# Patient Record
Sex: Female | Born: 1975 | Race: Black or African American | Hispanic: No | Marital: Married | Smoking: Never smoker
Health system: Southern US, Community
[De-identification: ages and names within clinical notes are randomized; demographics above are authoritative.]

## PROBLEM LIST (undated history)

## (undated) DIAGNOSIS — R7303 Prediabetes: Secondary | ICD-10-CM

## (undated) DIAGNOSIS — E66813 Obesity, class 3: Secondary | ICD-10-CM

## (undated) DIAGNOSIS — Z8679 Personal history of other diseases of the circulatory system: Secondary | ICD-10-CM

## (undated) DIAGNOSIS — D573 Sickle-cell trait: Secondary | ICD-10-CM

## (undated) DIAGNOSIS — E0500 Thyrotoxicosis with diffuse goiter without thyrotoxic crisis or storm: Secondary | ICD-10-CM

## (undated) HISTORY — DX: Thyrotoxicosis with diffuse goiter without thyrotoxic crisis or storm: E05.00

## (undated) HISTORY — DX: Sickle-cell trait: D57.3

## (undated) HISTORY — DX: Morbid (severe) obesity due to excess calories: E66.01

## (undated) HISTORY — DX: Prediabetes: R73.03

## (undated) HISTORY — DX: Obesity, class 3: E66.813

## (undated) HISTORY — DX: Personal history of other diseases of the circulatory system: Z86.79

---

## 2005-08-25 ENCOUNTER — Ambulatory Visit: Payer: Self-pay | Admitting: Internal Medicine

## 2005-09-14 ENCOUNTER — Inpatient Hospital Stay (HOSPITAL_COMMUNITY): Admission: AD | Admit: 2005-09-14 | Discharge: 2005-09-15 | Payer: Self-pay | Admitting: Obstetrics and Gynecology

## 2005-10-14 ENCOUNTER — Encounter (INDEPENDENT_AMBULATORY_CARE_PROVIDER_SITE_OTHER): Payer: Self-pay | Admitting: Specialist

## 2005-10-14 ENCOUNTER — Inpatient Hospital Stay (HOSPITAL_COMMUNITY): Admission: AD | Admit: 2005-10-14 | Discharge: 2005-10-15 | Payer: Self-pay | Admitting: Obstetrics and Gynecology

## 2005-10-14 ENCOUNTER — Ambulatory Visit: Payer: Self-pay | Admitting: Obstetrics and Gynecology

## 2005-11-01 ENCOUNTER — Inpatient Hospital Stay (HOSPITAL_COMMUNITY): Admission: AD | Admit: 2005-11-01 | Discharge: 2005-11-01 | Payer: Self-pay | Admitting: *Deleted

## 2005-11-15 ENCOUNTER — Ambulatory Visit: Payer: Self-pay | Admitting: Obstetrics and Gynecology

## 2005-11-17 ENCOUNTER — Ambulatory Visit: Payer: Self-pay | Admitting: Obstetrics and Gynecology

## 2005-11-17 ENCOUNTER — Ambulatory Visit (HOSPITAL_COMMUNITY): Admission: RE | Admit: 2005-11-17 | Discharge: 2005-11-17 | Payer: Self-pay | Admitting: *Deleted

## 2005-12-29 ENCOUNTER — Ambulatory Visit: Payer: Self-pay | Admitting: Obstetrics and Gynecology

## 2005-12-29 ENCOUNTER — Encounter: Payer: Self-pay | Admitting: Family Medicine

## 2006-03-25 IMAGING — US US OB COMP LESS 14 WK
1 series · 14 of 21 positions shown · non-contrast
Comparison: none

CLINICAL DATA: Heavy vaginal bleeding.  Possible abortion. Patient should be 14 weeks 1 day by previous ultrasound.
 OBSTETRICAL ULTRASOUND <14 WKS:
TECHNIQUE: Transabdominal ultrasound was performed for evaluation of the gestation as well as the maternal uterus and adnexal regions.

[Series 1: us ob comp less 14 wk · 0.31mm/px · 14 of 21 slices shown]
[im 1/21]
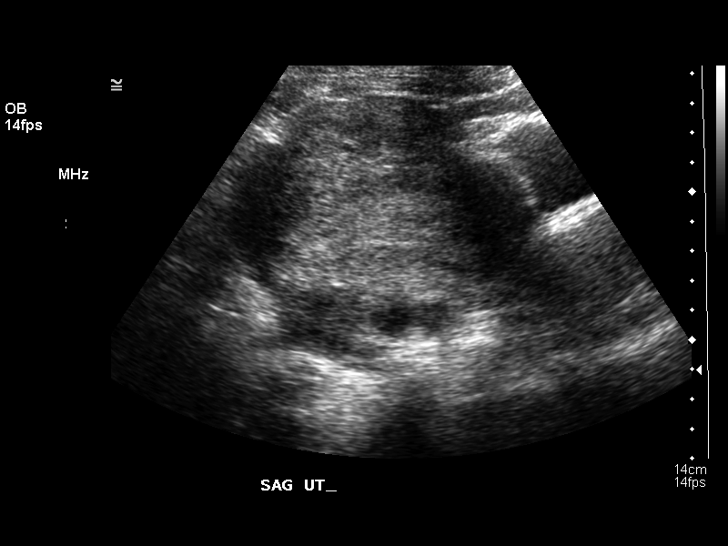
[im 3/21]
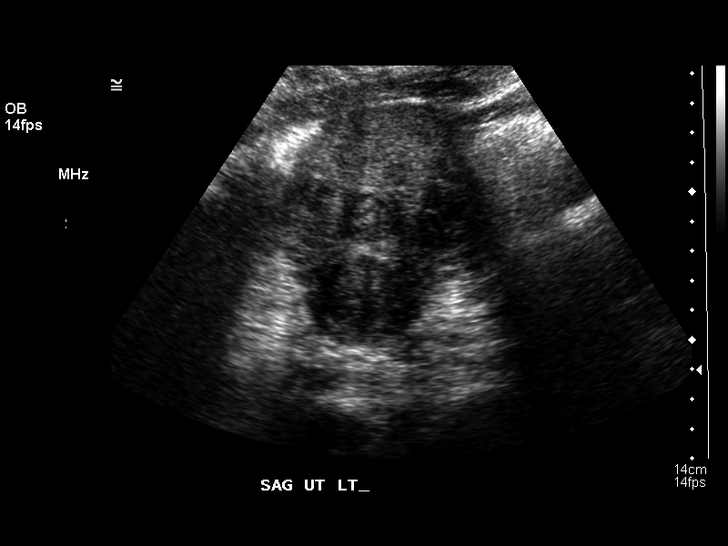
[im 4/21]
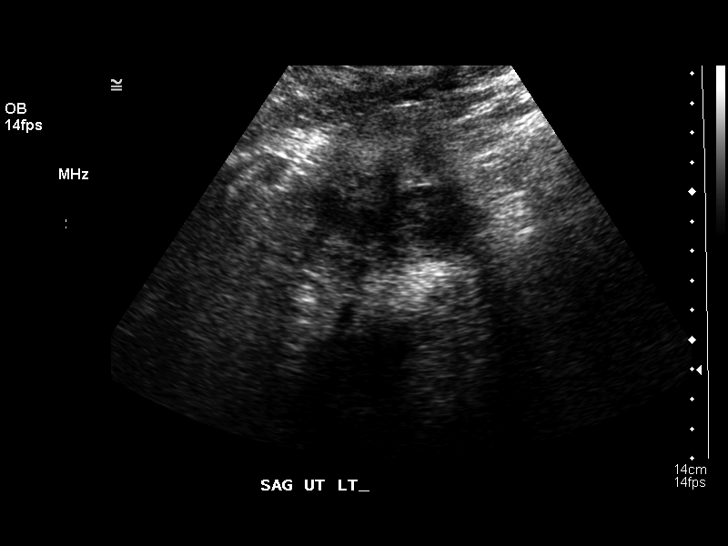
[im 6/21]
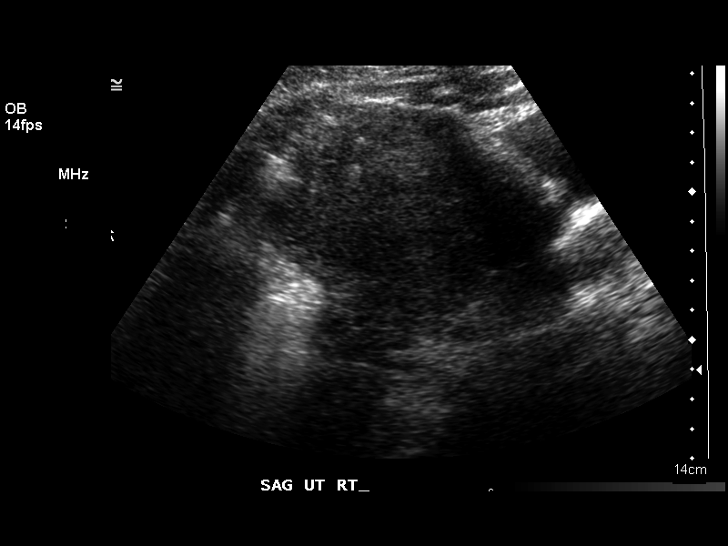
[im 7/21]
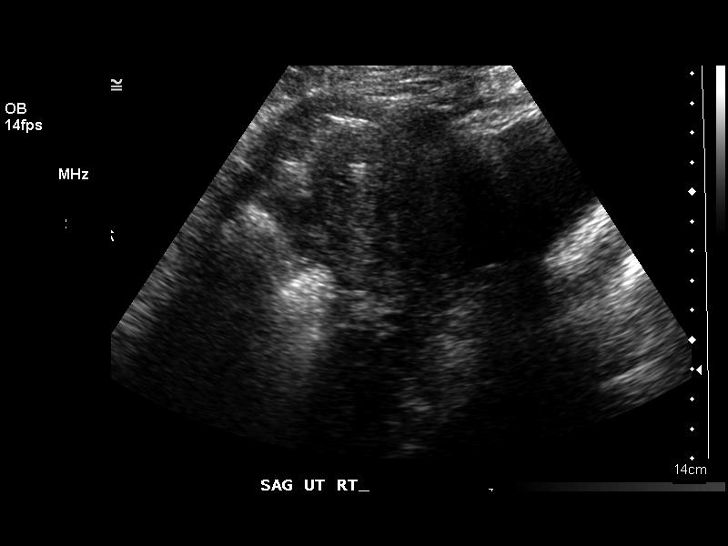
[im 9/21]
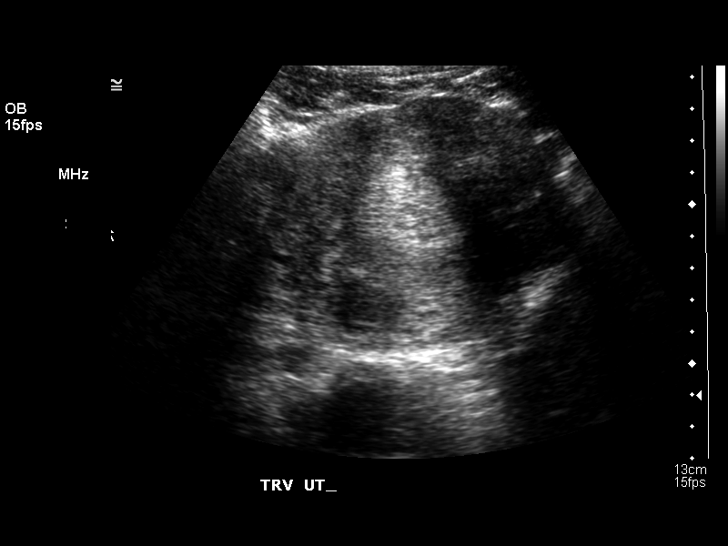
[im 10/21]
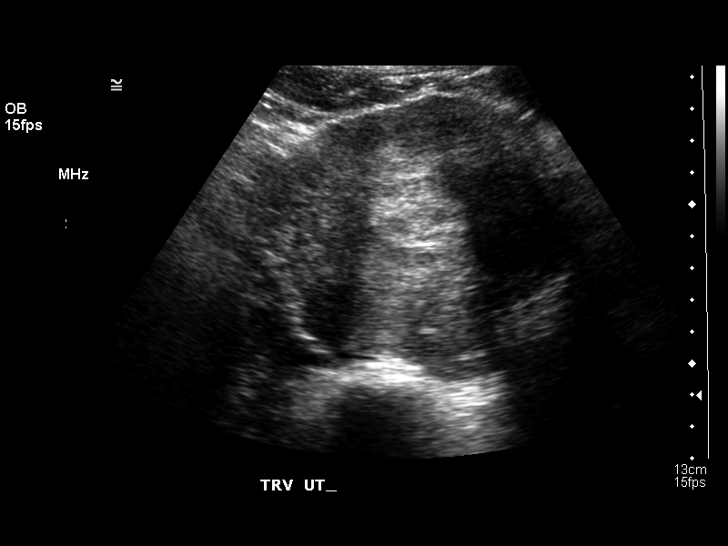
[im 12/21]
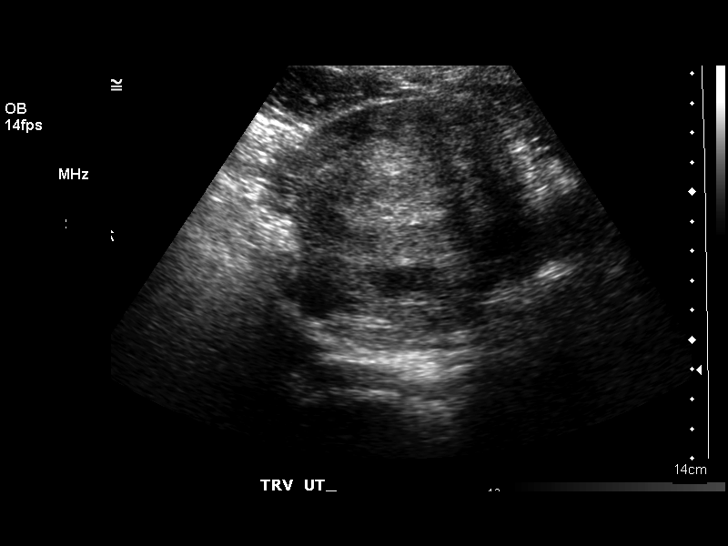
[im 13/21]
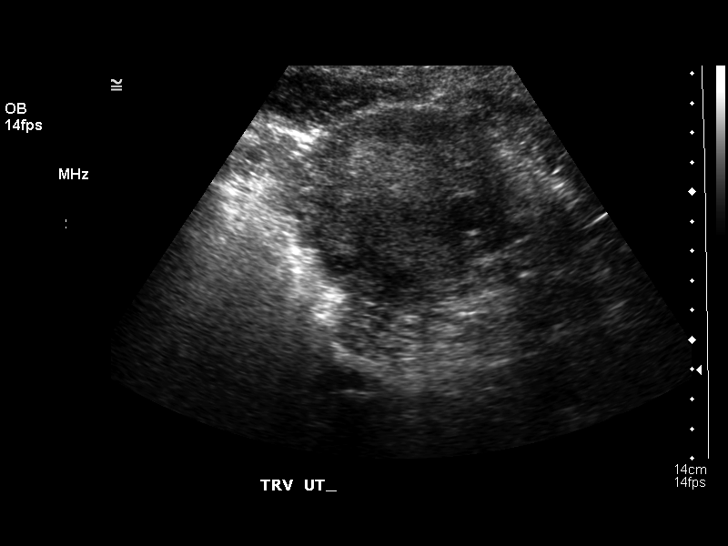
[im 15/21]
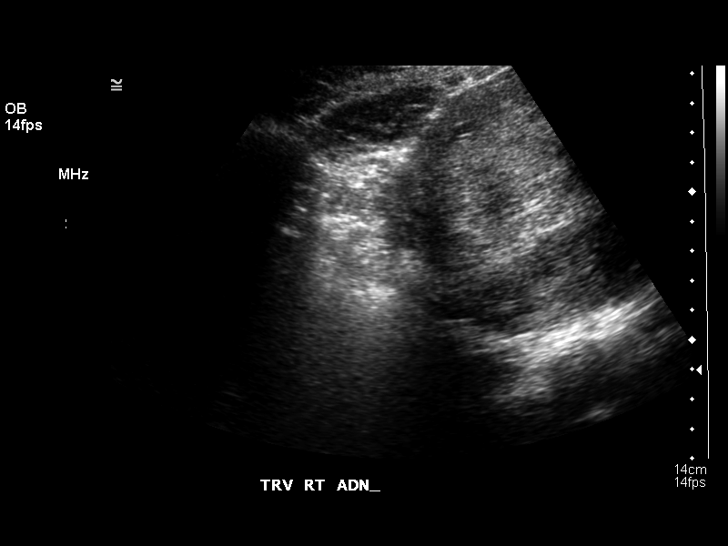
[im 16/21]
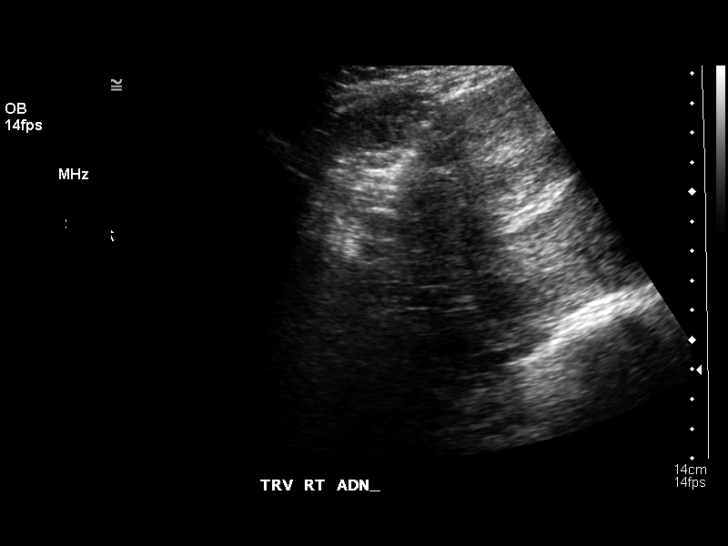
[im 18/21]
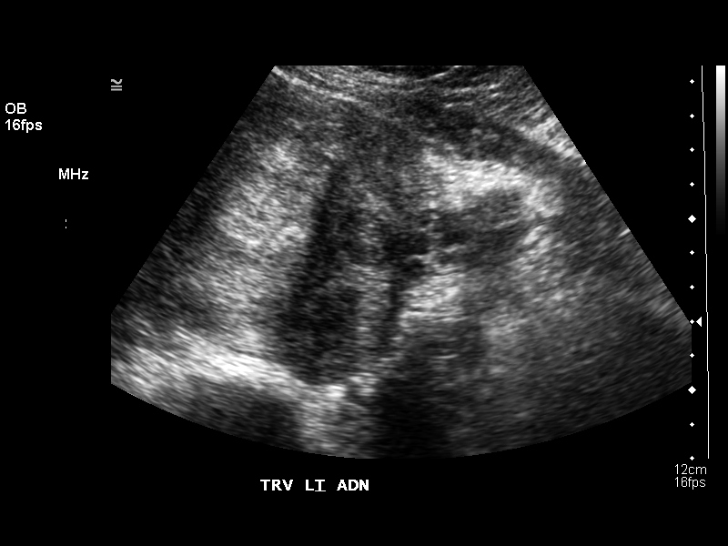
[im 19/21]
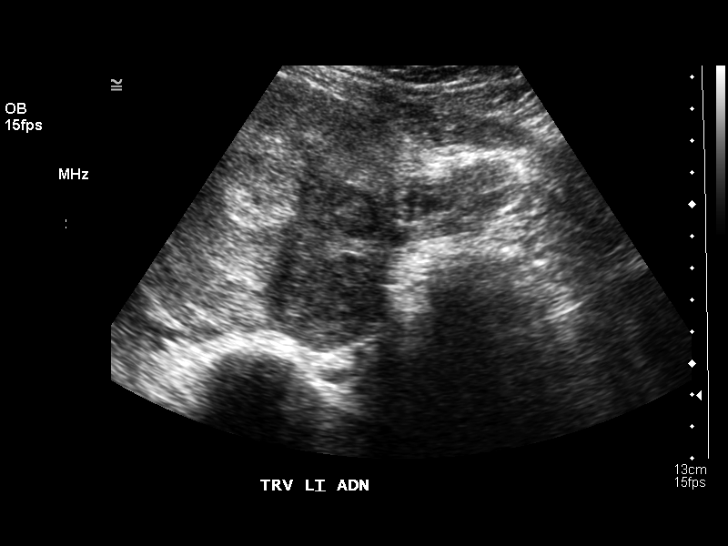
[im 21/21]
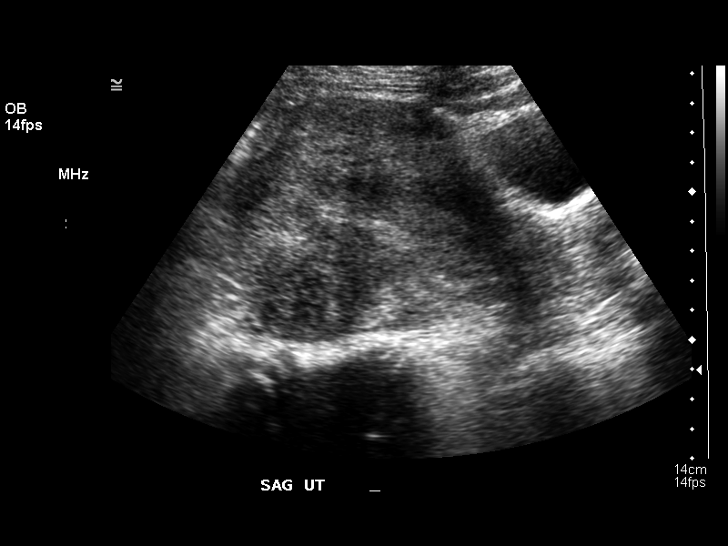

[14 of 21 positions shown; findings below may reference images not displayed]

FINDINGS: There is no sign of a viable pregnancy in the uterus. There is some echogenic material in the endometrial canal, but nothing that looks like a yolk sac or a fetus.  The findings are consistent with completed abortion.  
 The ovaries were not identified.  There is no free fluid.
IMPRESSION: No intrauterine pregnancy.  There is some echogenic material in the endometrial canal.  The findings are consistent with completed abortion.

## 2006-08-10 ENCOUNTER — Inpatient Hospital Stay (HOSPITAL_COMMUNITY): Admission: AD | Admit: 2006-08-10 | Discharge: 2006-08-10 | Payer: Self-pay | Admitting: Obstetrics & Gynecology

## 2007-01-17 ENCOUNTER — Ambulatory Visit: Payer: Self-pay | Admitting: Obstetrics & Gynecology

## 2007-01-17 ENCOUNTER — Encounter (INDEPENDENT_AMBULATORY_CARE_PROVIDER_SITE_OTHER): Payer: Self-pay | Admitting: Obstetrics & Gynecology

## 2007-01-19 ENCOUNTER — Ambulatory Visit (HOSPITAL_COMMUNITY): Admission: RE | Admit: 2007-01-19 | Discharge: 2007-01-19 | Payer: Self-pay | Admitting: Gynecology

## 2007-02-14 ENCOUNTER — Ambulatory Visit: Payer: Self-pay | Admitting: *Deleted

## 2007-03-07 ENCOUNTER — Ambulatory Visit: Payer: Self-pay | Admitting: Obstetrics & Gynecology

## 2007-03-28 ENCOUNTER — Inpatient Hospital Stay (HOSPITAL_COMMUNITY): Admission: AD | Admit: 2007-03-28 | Discharge: 2007-03-28 | Payer: Self-pay | Admitting: Obstetrics and Gynecology

## 2007-09-02 ENCOUNTER — Inpatient Hospital Stay (HOSPITAL_COMMUNITY): Admission: AD | Admit: 2007-09-02 | Discharge: 2007-09-06 | Payer: Self-pay | Admitting: Obstetrics & Gynecology

## 2007-09-03 ENCOUNTER — Encounter: Payer: Self-pay | Admitting: Obstetrics

## 2009-05-08 ENCOUNTER — Encounter: Admission: RE | Admit: 2009-05-08 | Discharge: 2009-05-08 | Payer: Self-pay | Admitting: Obstetrics and Gynecology

## 2009-10-05 ENCOUNTER — Encounter: Admission: RE | Admit: 2009-10-05 | Discharge: 2009-10-05 | Payer: Self-pay | Admitting: Obstetrics and Gynecology

## 2009-11-17 ENCOUNTER — Encounter: Admission: RE | Admit: 2009-11-17 | Discharge: 2009-11-17 | Payer: Self-pay | Admitting: Obstetrics and Gynecology

## 2010-01-13 ENCOUNTER — Emergency Department (HOSPITAL_COMMUNITY): Admission: EM | Admit: 2010-01-13 | Discharge: 2010-01-13 | Payer: Self-pay | Admitting: Emergency Medicine

## 2010-03-17 ENCOUNTER — Encounter: Admission: RE | Admit: 2010-03-17 | Discharge: 2010-03-17 | Payer: Self-pay | Admitting: Obstetrics and Gynecology

## 2010-05-18 ENCOUNTER — Encounter: Admission: RE | Admit: 2010-05-18 | Discharge: 2010-05-18 | Payer: Self-pay | Admitting: Obstetrics and Gynecology

## 2010-05-24 ENCOUNTER — Encounter: Admission: RE | Admit: 2010-05-24 | Discharge: 2010-05-24 | Payer: Self-pay | Admitting: Obstetrics and Gynecology

## 2011-02-07 ENCOUNTER — Other Ambulatory Visit: Payer: Self-pay | Admitting: Obstetrics

## 2011-02-07 DIAGNOSIS — O09529 Supervision of elderly multigravida, unspecified trimester: Secondary | ICD-10-CM

## 2011-02-09 ENCOUNTER — Ambulatory Visit (HOSPITAL_COMMUNITY)
Admission: RE | Admit: 2011-02-09 | Discharge: 2011-02-09 | Disposition: A | Discharge status: Home / Self Care | Payer: Medicaid Other | Source: Ambulatory Visit | Attending: Obstetrics | Admitting: Obstetrics

## 2011-02-09 ENCOUNTER — Other Ambulatory Visit: Payer: Self-pay | Admitting: Obstetrics

## 2011-02-09 DIAGNOSIS — O99210 Obesity complicating pregnancy, unspecified trimester: Secondary | ICD-10-CM | POA: Insufficient documentation

## 2011-02-09 DIAGNOSIS — O409XX0 Polyhydramnios, unspecified trimester, not applicable or unspecified: Secondary | ICD-10-CM | POA: Insufficient documentation

## 2011-02-09 DIAGNOSIS — O09529 Supervision of elderly multigravida, unspecified trimester: Secondary | ICD-10-CM | POA: Insufficient documentation

## 2011-02-09 DIAGNOSIS — O99810 Abnormal glucose complicating pregnancy: Secondary | ICD-10-CM | POA: Insufficient documentation

## 2011-02-09 DIAGNOSIS — O24919 Unspecified diabetes mellitus in pregnancy, unspecified trimester: Secondary | ICD-10-CM

## 2011-02-09 DIAGNOSIS — E669 Obesity, unspecified: Secondary | ICD-10-CM | POA: Insufficient documentation

## 2011-02-11 ENCOUNTER — Ambulatory Visit (HOSPITAL_COMMUNITY)
Admission: RE | Admit: 2011-02-11 | Discharge: 2011-02-11 | Disposition: A | Discharge status: Home / Self Care | Payer: Medicaid Other | Source: Ambulatory Visit | Attending: Obstetrics | Admitting: Obstetrics

## 2011-02-11 DIAGNOSIS — O409XX0 Polyhydramnios, unspecified trimester, not applicable or unspecified: Secondary | ICD-10-CM | POA: Insufficient documentation

## 2011-02-11 DIAGNOSIS — E669 Obesity, unspecified: Secondary | ICD-10-CM | POA: Insufficient documentation

## 2011-02-11 DIAGNOSIS — O99210 Obesity complicating pregnancy, unspecified trimester: Secondary | ICD-10-CM | POA: Insufficient documentation

## 2011-02-11 DIAGNOSIS — O09529 Supervision of elderly multigravida, unspecified trimester: Secondary | ICD-10-CM | POA: Insufficient documentation

## 2011-02-11 DIAGNOSIS — O99810 Abnormal glucose complicating pregnancy: Secondary | ICD-10-CM | POA: Insufficient documentation

## 2011-02-15 ENCOUNTER — Ambulatory Visit (HOSPITAL_COMMUNITY)
Admission: RE | Admit: 2011-02-15 | Discharge: 2011-02-15 | Disposition: A | Discharge status: Home / Self Care | Payer: Medicaid Other | Source: Ambulatory Visit | Attending: Obstetrics | Admitting: Obstetrics

## 2011-02-15 DIAGNOSIS — O99210 Obesity complicating pregnancy, unspecified trimester: Secondary | ICD-10-CM | POA: Insufficient documentation

## 2011-02-15 DIAGNOSIS — O409XX0 Polyhydramnios, unspecified trimester, not applicable or unspecified: Secondary | ICD-10-CM | POA: Insufficient documentation

## 2011-02-15 DIAGNOSIS — O99810 Abnormal glucose complicating pregnancy: Secondary | ICD-10-CM | POA: Insufficient documentation

## 2011-02-15 DIAGNOSIS — O09529 Supervision of elderly multigravida, unspecified trimester: Secondary | ICD-10-CM | POA: Insufficient documentation

## 2011-02-15 DIAGNOSIS — E669 Obesity, unspecified: Secondary | ICD-10-CM | POA: Insufficient documentation

## 2011-02-17 ENCOUNTER — Ambulatory Visit (HOSPITAL_COMMUNITY)
Admission: RE | Admit: 2011-02-17 | Discharge: 2011-02-17 | Disposition: A | Discharge status: Home / Self Care | Payer: Medicaid Other | Source: Ambulatory Visit | Attending: Obstetrics | Admitting: Obstetrics

## 2011-02-17 DIAGNOSIS — O99810 Abnormal glucose complicating pregnancy: Secondary | ICD-10-CM | POA: Insufficient documentation

## 2011-02-18 ENCOUNTER — Ambulatory Visit (HOSPITAL_COMMUNITY)
Admission: RE | Admit: 2011-02-18 | Discharge: 2011-02-18 | Disposition: A | Discharge status: Home / Self Care | Payer: Medicaid Other | Source: Ambulatory Visit | Attending: Obstetrics | Admitting: Obstetrics

## 2011-02-22 ENCOUNTER — Ambulatory Visit (HOSPITAL_COMMUNITY)
Admission: RE | Admit: 2011-02-22 | Discharge: 2011-02-22 | Disposition: A | Discharge status: Home / Self Care | Payer: Medicaid Other | Source: Ambulatory Visit | Attending: Obstetrics | Admitting: Obstetrics

## 2011-02-22 DIAGNOSIS — O409XX0 Polyhydramnios, unspecified trimester, not applicable or unspecified: Secondary | ICD-10-CM | POA: Insufficient documentation

## 2011-02-22 DIAGNOSIS — O99810 Abnormal glucose complicating pregnancy: Secondary | ICD-10-CM | POA: Insufficient documentation

## 2011-02-22 DIAGNOSIS — O09529 Supervision of elderly multigravida, unspecified trimester: Secondary | ICD-10-CM | POA: Insufficient documentation

## 2011-02-24 LAB — POCT PREGNANCY, URINE: Preg Test, Ur: NEGATIVE

## 2011-02-24 LAB — URINALYSIS, ROUTINE W REFLEX MICROSCOPIC
Bilirubin Urine: NEGATIVE
Glucose, UA: NEGATIVE mg/dL
Hgb urine dipstick: NEGATIVE
Ketones, ur: NEGATIVE mg/dL
Nitrite: NEGATIVE
Protein, ur: NEGATIVE mg/dL
Specific Gravity, Urine: 1.005 (ref 1.005–1.030)
Urobilinogen, UA: 1 mg/dL (ref 0.0–1.0)
pH: 7 (ref 5.0–8.0)

## 2011-02-25 ENCOUNTER — Ambulatory Visit (HOSPITAL_COMMUNITY): Payer: Medicaid Other

## 2011-02-25 ENCOUNTER — Other Ambulatory Visit: Payer: Self-pay | Admitting: Obstetrics

## 2011-02-25 ENCOUNTER — Ambulatory Visit (HOSPITAL_COMMUNITY)
Admission: RE | Admit: 2011-02-25 | Discharge: 2011-02-25 | Disposition: A | Discharge status: Home / Self Care | Payer: Medicaid Other | Source: Ambulatory Visit | Attending: Obstetrics | Admitting: Obstetrics

## 2011-02-25 DIAGNOSIS — O99810 Abnormal glucose complicating pregnancy: Secondary | ICD-10-CM | POA: Insufficient documentation

## 2011-02-25 DIAGNOSIS — O24919 Unspecified diabetes mellitus in pregnancy, unspecified trimester: Secondary | ICD-10-CM

## 2011-02-25 DIAGNOSIS — O09529 Supervision of elderly multigravida, unspecified trimester: Secondary | ICD-10-CM | POA: Insufficient documentation

## 2011-02-27 ENCOUNTER — Inpatient Hospital Stay (HOSPITAL_COMMUNITY)
Admission: AD | Admit: 2011-02-27 | Discharge: 2011-03-03 | DRG: 766 | Disposition: A | Discharge status: Home / Self Care | Payer: Medicaid Other | Source: Ambulatory Visit | Attending: Obstetrics | Admitting: Obstetrics

## 2011-02-27 ENCOUNTER — Other Ambulatory Visit: Payer: Self-pay | Admitting: Obstetrics

## 2011-02-27 DIAGNOSIS — O322XX0 Maternal care for transverse and oblique lie, not applicable or unspecified: Secondary | ICD-10-CM | POA: Diagnosis present

## 2011-02-27 DIAGNOSIS — O99814 Abnormal glucose complicating childbirth: Secondary | ICD-10-CM | POA: Diagnosis present

## 2011-02-27 DIAGNOSIS — O34219 Maternal care for unspecified type scar from previous cesarean delivery: Principal | ICD-10-CM | POA: Diagnosis present

## 2011-02-27 DIAGNOSIS — O09529 Supervision of elderly multigravida, unspecified trimester: Secondary | ICD-10-CM | POA: Diagnosis present

## 2011-02-27 LAB — TYPE AND SCREEN: ABO/RH(D): O POS

## 2011-02-27 LAB — CBC
HCT: 38.2 % (ref 36.0–46.0)
MCH: 29.4 pg (ref 26.0–34.0)
MCHC: 34 g/dL (ref 30.0–36.0)
Platelets: 160 10*3/uL (ref 150–400)
RDW: 14.7 % (ref 11.5–15.5)

## 2011-02-27 LAB — GLUCOSE, CAPILLARY
Glucose-Capillary: 105 mg/dL — ABNORMAL HIGH (ref 70–99)
Glucose-Capillary: 191 mg/dL — ABNORMAL HIGH (ref 70–99)

## 2011-02-27 LAB — ABO/RH: ABO/RH(D): O POS

## 2011-02-28 LAB — CBC
HCT: 27.3 % — ABNORMAL LOW (ref 36.0–46.0)
Hemoglobin: 9.2 g/dL — ABNORMAL LOW (ref 12.0–15.0)
MCH: 29.3 pg (ref 26.0–34.0)
MCHC: 33.7 g/dL (ref 30.0–36.0)
MCV: 86.9 fL (ref 78.0–100.0)
RDW: 15.1 % (ref 11.5–15.5)

## 2011-03-02 ENCOUNTER — Ambulatory Visit (HOSPITAL_COMMUNITY): Payer: Medicaid Other

## 2011-03-09 NOTE — Discharge Summary (Signed)
  NAMEGALINA, Tonya Stokes           ACCOUNT NO.:  [id number]  MEDICAL RECORD NO.:  [id number]           PATIENT TYPE:  I  LOCATION:  9319                          [facility]  PHYSICIAN:  Roseanna Rainbow, M.D.DATE OF BIRTH:  [birth year]  DATE OF ADMISSION:  02/27/2011 DATE OF DISCHARGE:  03/03/2011                              DISCHARGE SUMMARY   CHIEF COMPLAINT:  The patient is a 35 year old gravida 3, para 1 with an estimated date of confinement of March 16, 2011 who presents complaining of leaking clear fluid.  HISTORY OF PRESENT ILLNESS:  Please see the above antepartum course.  OBSTETRICAL RISK FACTORS: 1. History of previous cesarean delivery. 2. History of a previous myomectomy. 3. Gestational diabetes, on an oral agent.  PAST SURGICAL HISTORY:  Please see the above.  HOME MEDICATIONS:  Prenatal vitamins and glyburide.  ALLERGIES:  No known drug allergies.  SOCIAL HISTORY:  She denies any tobacco, ethanol, or drug use.  FAMILY HISTORY:  Diabetes mellitus.  PHYSICAL EXAMINATION:  VITAL SIGNS:  Stable, afebrile. LUNGS:  Clear to auscultation bilaterally. HEART:  Regular rate and rhythm. ABDOMEN:  Gravid, nontender. PELVIC:  Cervix is long and closed.  ASSESSMENT: 1. Intrauterine pregnancy at 37+ weeks. 2. History of a previous cesarean delivery. 3. Prior myomectomy.  PLAN:  Admission, repeat cesarean delivery.  HOSPITAL COURSE:  The patient was admitted and underwent a repeat cesarean delivery.  Please see the dictated operative summary for findings.  On postoperative day #1, the hemoglobin was 9.  Mefoxin was continued postoperatively for surgical site infection prophylaxis.  The remainder of her hospital course was uneventful.  She was discharged to home on postoperative day #4.  DISCHARGE DIAGNOSES: 1. Intrauterine pregnancy at 37 weeks. 2. Spontaneous rupture of membranes. 3. History of a previous cesarean delivery. 4. Prior  yomectomy.  PROCEDURES:  Repeat classical cesarean delivery.  CONDITION:  Stable.  DIET:  Regular.  ACTIVITY:  Pelvic rest, progressive activity.  DISCHARGE MEDICATIONS:  Percocet 5/325 one to two tablets every 6 hours as needed.  DISPOSITION:  The patient was to follow up in the office in several days for staple removal.     Roseanna Rainbow, M.D.     LAJ/MEDQ  D:  03/03/2011  T:  03/04/2011  Job:  [id number]  Electronically Signed by Antionette Char M.D. on 03/09/2011 09:59:53 PM

## 2011-03-10 NOTE — Op Note (Signed)
Tonya Stokes, UNCAPHER           ACCOUNT NO.:  [id number]  MEDICAL RECORD NO.:  [id number]           PATIENT TYPE:  I  LOCATION:  9319                          [facility]  PHYSICIAN:  Marlita Keil A. Clearance Coots, M.D.DATE OF BIRTH:  11/18/1976  DATE OF PROCEDURE:  02/27/2011 DATE OF DISCHARGE:                              OPERATIVE REPORT   PREOPERATIVE DIAGNOSES:  A 37 weeks' gestation, spontaneous rupture of membranes, gestational diabetes, previous myomectomy, previous cesarean section.  POSTOPERATIVE DIAGNOSES:  A 37 weeks' gestation, spontaneous rupture of membranes, gestational diabetes, previous myomectomy, previous cesarean section, transverse lie.  PROCEDURE:  Repeat classical cesarean section.  SURGEON:  Darrik Richman A. Clearance Coots, M.D.  ANESTHESIA:  Spinal.  ESTIMATED BLOOD LOSS:  1200 mL.  IV FLUIDS:  3500 mL.  URINE OUTPUT:  300 mL clear.  COMPLICATIONS:  None.  Foley to gravity.  FINDINGS:  Viable female at 04:03, Apgars of 2 at 1 minute, 5 at 7 minutes, 7 at 10 minutes, weight of 3101 g.  Normal uterus, ovaries, and fallopian tubes.  SPECIMEN:  Placenta.  DISPOSITION OF SPECIMEN:  Pathology.  OPERATION:  The patient was brought to the operating room and after satisfactory spinal anesthesia, the abdomen was prepped and draped in the usual sterile fashion with an indwelling Foley.  A Pfannenstiel skin incision was made with the scalpel through the previous scar down to the fascia.  The fascia was nicked in the midline and the fascial incision was extended to the left and to the right with curved Mayo scissors. The superior and inferior fascial edges were then taken off of the rectus muscles, both blunt and sharp dissection.  The rectus muscle was bluntly divided in the midline and sharply incised superiorly and inferiorly.  The parietal peritoneum was then entered and the incision was extended superiorly and inferiorly with Metzenbaum scissors being careful to  avoid the urinary bladder inferiorly.  The bladder blade was then positioned in the incision.  There was no development of the lower uterine segment with multiple vascular sinuses in the very narrow lower uterine segment.  A decision was made to proceed with classical incision into the uterus.  Classical incision was made with the scalpel extending the incision up into the uterine fundus.  The fetus was noted to be a transverse lie and the vertex was almost obliquely into the incision and the vertex was therefore rotated into the incision with much difficulty because of hyperextension of the vertex and the fetus essentially balled up in a transverse lie.  I could not get adequate access to the vertex and therefore, a vertical skin incision was also made with the scalpel down to the fascia.  The fascial incision was extended superiorly and inferiorly with curved Mayo scissors.  The rectus muscle was bluntly divided in the midline.  Adequate access was obtained after converting the skin incision to a vertical skin incision.  The occiput was rotated into the incision and mushroom Mityvac was placed on the occiput and the vertex was extended into the incision and delivered with the aid of one pull of the vacuum extractor and fundal pressure from the assistant. The  infant's mouth and nose were suctioned with a suction bulb and the delivery was completed with the aid of fundal pressure from the assistant.  Umbilical cord was doubly clamped and cut and the infant was handed off to the nursery staff.  Cord pH and cord blood were obtained. Placenta was manually removed from the uterus intact.  The uterus was exteriorized.  The uterus was closed with the classical incision closed first in 3 layers with inner layer of continuous interlocking suture of 0 Monocryl and the myometrial layer closed with a continuous suture of 0 Monocryl.  The serosa and outer myometrium was closed with  continuous interlocking suture of 0 Monocryl.  Hemostasis was excellent.  The pelvic cavity was then thoroughly irrigated with warm saline solution and all clots were removed.  The abdomen was then closed as follows. The peritoneum was closed with a continuous suture of 2-0 Monocryl.  The fascia was closed in 2 approaches, the vertical fascial incision was closed with a continuous suture of #1 PDS.  The transverse fascial incision was closed with a continuous suture of #1 PDS from each corner to the center.  The vertical subcutaneous tissue was closed with a continuous suture of 0 plain catgut.  The vertical skin was closed with stainless steel staples.  The Pfannenstiel skin incision was closed with stainless steel staples.  A sterile pressure bandage was applied to the incision closure.  The patient tolerated the procedure well, was transported to recovery room in satisfactory condition.     Tonya Stokes A. Clearance Coots, M.D.     CAH/MEDQ  D:  02/27/2011  T:  02/27/2011  Job:  [id number]  Electronically Signed by Coral Ceo M.D. on 03/10/2011 11:18:52 AM

## 2011-04-19 NOTE — Op Note (Signed)
Tonya Stokes, Tonya Stokes           ACCOUNT NO.:  [id number]   MEDICAL RECORD NO.:  [id number]          PATIENT TYPE:  INP   LOCATION:  9166                          [facility]   PHYSICIAN:  Charles A. Clearance Coots, M.D.DATE OF BIRTH:  11-19-1976   DATE OF PROCEDURE:  09/03/2007  DATE OF DISCHARGE:                               OPERATIVE REPORT   PREOPERATIVE DIAGNOSES:  1. Forty-one weeks' gestation.  2. Previous myomectomy.  3. Elective cesarean section.   POSTOPERATIVE DIAGNOSES:  1. Forty-one weeks' gestation.  2. Previous myomectomy.  3. Elective cesarean section.   PROCEDURE:  Primary low transverse cesarean section.   SURGEON:  Charles A. Clearance Coots, M.D.   ASSISTANT:  Kathlee Nations, certified surgical technician.   ANESTHESIA:  Spinal.   ESTIMATED BLOOD LOSS:  800 mL.   IV FLUIDS:  2000 mL.   URINE OUTPUT:  100 mL clear.   COMPLICATIONS:  None.   Foley to gravity.   FINDINGS:  A viable female at 11:50, Apgars of 8 at one minute and 9 at  five minutes, a weight of 9 pounds 6 ounces.  Normal uterus, ovaries and  fallopian tubes.   OPERATION:  The patient was brought to the operating room and after  satisfactory spinal anesthesia, the abdomen was prepped and draped in  the usual sterile fashion.  A Pfannenstiel skin incision was made with  down to the fascia with a scalpel.  Fascia was nicked in the midline and  the fascial incision was extended to the left and to the right with  curved Mayo scissors.  The superior and inferior fascial edges were  taken off of the rectus muscles with both blunt sharp dissection.  The  rectus muscle was bluntly and sharply divided in the midline and the  peritoneum was entered digitally and was digitally extended to the left  and to the right.  The bladder blade was positioned and the  vesicouterine fold of peritoneum above the reflection of the urinary  bladder was grasped with forceps and was incised and undermined with  Metzenbaum scissors.  The incision was extended to left and to the right  with the Metzenbaum scissors.  The bladder flap was bluntly developed  and the bladder blade was repositioned in front of the urinary bladder,  placing it well out of the operative field.  The uterus was then entered  transversely in the lower uterine segment with the scalpel.  Clear  amniotic fluid was expelled.  The uterine incision was then extended to  the left and to the right digitally.  The occiput was noted to be left  occiput posterior and the occiput was rotated to an anterior position  and brought up into the incision.  There was still some difficulty in  delivering the vertex and then a Mityvac mushroom cap-type vacuum  extractor was then applied to the occiput and the vertex was then flexed  into the incision and the delivery was accomplished with the aid of  vacuum extraction and fundal pressure from the assistant.  The infant's  mouth and nose were suctioned with a suction bulb and delivery  was  completed with the aid of fundal pressure from the assistant.  The  placenta was then manually removed from the uterus intact.  The  endometrial surface was thoroughly debrided with a dry lap sponge.  The  edges of the uterine incision were grasped with Allis and ring forceps  and the uterus was closed from each corner to the center with a  continuous interlocking suture of zero Monocryl.  Hemostasis was  excellent.  The pelvic cavity was then thoroughly irrigated with warm  saline solution and all clots were removed.  The abdomen was then closed  as follows:  The parietal peritoneum was closed with a continuous suture  of 2-0 Monocryl.  The fascia was closed with a continuous suture of 0  PDS from each corner to the center.  The subcutaneous tissue was  thoroughly irrigated with warm saline solution and all areas of  subcutaneous bleeding were coagulated with the Bovie.  The skin was then  closed with  stainless steel staples.  Sterile bandages applied to the  incision closure.  The surgical technician then again indicated that all  needle, sponge and instrument counts were correct x2.  The patient  tolerated the procedure well and was transported to the recovery room in  satisfactory condition.      Charles A. Clearance Coots, M.D.  Electronically Signed     CAH/MEDQ  D:  09/03/2007  T:  09/03/2007  Job:  [id number]

## 2011-04-19 NOTE — Discharge Summary (Signed)
Tonya Stokes, Tonya Stokes           ACCOUNT NO.:  [id number]   MEDICAL RECORD NO.:  [id number]          PATIENT TYPE:  INP   LOCATION:  9102                          [facility]   PHYSICIAN:  Charles A. Clearance Coots, M.D.DATE OF BIRTH:  1976/11/06   DATE OF ADMISSION:  09/02/2007  DATE OF DISCHARGE:  09/06/2007                               DISCHARGE SUMMARY   ADMITTING DIAGNOSIS:  Post-dates pregnancy, induction of labor.   DISCHARGE DIAGNOSIS:  Post-dates pregnancy, induction of labor.  Status  post primary low transverse cesarean section for previous myomectomy.  Viable female delivered via on September 03, 2007 at 11:50.  Apgars of 8  at one minute and 9 at five minutes, weight of 42-65 grams, length of  53.5 cm.  Mother and infant discharged home in good condition.   REASON FOR ADMISSION:  A 35 year old, para 0, black female presented for  induction of labor for postdates.  Upon review of the patient's surgical  history, it was discovered that she had had a previous myomectomy for  fibroids and was recommended that she not have vaginal deliveries for  any future pregnancies, and cesarean section was recommended.  The  patient was therefore apprised of these recommendations and was taken to  the operating room for cesarean section delivery.   PAST MEDICAL HISTORY:  Surgery:  Cesarean section.  Illnesses:  Sickle  cell trait.   MEDICATIONS:  Prenatal vitamins.   ALLERGIES:  NO KNOWN DRUG ALLERGIES.   SOCIAL HISTORY:  Married.  Negative history of tobacco, alcohol or  recreational drug use.   FAMILY HISTORY:  Noncontributory.   PHYSICAL EXAMINATION:  Well-nourished, well-developed female in no acute  distress.  Afebrile, vital signs were stable.  LUNGS:  Were clear to auscultation bilaterally.  HEART:  Regular rate and rhythm.  ABDOMEN:  Gravid, nontender.  CERVIX:  1 cm dilated, 50% effaced and vertex at a -3 station.   ADMITTING LABORATORY VALUES:  Hemoglobin 12, hematocrit  36, white blood  cell count 9000, platelets 177,000.   HOSPITAL COURSE:  The patient was admitted and was taken to the  operating room for primary low transverse cesarean section for previous  myomectomy.  There are no intraoperative complications.  Postoperative  course was uncomplicated, and the patient was discharged home on  postoperative day #3 in good condition.   DISCHARGE DISPOSITION:  Medications:  Continue prenatal vitamins.  Tylox  and ibuprofen was prescribed for pain.  Routine written instructions  were given for discharge after cesarean section.  The patient is to call  the office for a follow-up appointment in 2 weeks.      Charles A. Clearance Coots, M.D.  Electronically Signed     CAH/MEDQ  D:  09/06/2007  T:  09/06/2007  Job:  [id number]

## 2011-04-19 NOTE — Discharge Summary (Signed)
Tonya Stokes, Tonya Stokes           ACCOUNT NO.:  [id number]   MEDICAL RECORD NO.:  [id number]          PATIENT TYPE:  INP   LOCATION:  9166                          [facility]   PHYSICIAN:  Roseanna Rainbow, M.D.DATE OF BIRTH:  06-07-1976   DATE OF ADMISSION:  09/02/2007  DATE OF DISCHARGE:                               DISCHARGE SUMMARY   CHIEF COMPLAINTS:  The patient is 35 year old para 0 with an estimated  date of confinement of August 26, 2007, with an intrauterine  pregnancy at 41+ weeks, for induction of labor.   HISTORY OF PRESENT ILLNESS:  Please see the above.   ALLERGIES:  No known drug allergies.   PRENATAL SCREENING:  Hemoglobin 11.6, hematocrit 35.  Urine culture and  sensitivity:  No growth.  GC probe negative.  Chlamydia probe negative.  GBS positive.  One-hour GTT 139.  HIV nonreactive.  Pap smear negative.  Rubella immune.  Blood type was O positive, antibody screen negative.  Hepatitis B surface antigen negative.  Platelets 154,000.  Sickle cell  positive.   OBSTETRICAL RISK FACTORS:  Sickle cell positive, GBS positive, uterine  fibroids.   PAST GYNECOLOGICAL SURGERY:  Myomectomy.   PAST MEDICAL HISTORY:  No significant history of medical diseases.   PAST SURGICAL HISTORY:  Please see the above.   SOCIAL HISTORY:  She is married and living with her spouse.  She does  not give any significant history of alcohol usage, has no significant  smoking history, denies illicit drug use.   FAMILY HISTORY:  No major illnesses known.   PAST OBSTETRICAL HISTORY:  In November 2006, there was a spontaneous  abortion at 14 weeks with a subsequent D&C.   PHYSICAL EXAM:  VITAL SIGNS:  Stable, afebrile.  Fetal heart tracing  reassuring.  Tocodynamometer:  Irregular uterine contractions.  PELVIC:  On sterile vaginal exam per the R.N., the cervix was 1-cm  dilated, 50% effaced.   ASSESSMENT:  1. Intrauterine pregnancy at 41 weeks, for induction of labor.  2. History of abdominal myomectomy.  3. Unfavorable cervix.  4. Fetal heart tracing consistent with fetal well-being.   PLAN:  Admission, two-stage induction of labor, penicillin GBS  prophylaxis in labor.      Roseanna Rainbow, M.D.  Electronically Signed     LAJ/MEDQ  D:  09/03/2007  T:  09/03/2007  Job:  [id number]

## 2011-04-22 NOTE — Discharge Summary (Signed)
Tonya Stokes, Tonya Stokes           ACCOUNT NO.:  [id number]   MEDICAL RECORD NO.:  [id number]          PATIENT TYPE:  INP   LOCATION:  9371                          [facility]   PHYSICIAN:  Phil D. Okey Dupre, M.D.     DATE OF BIRTH:  12-07-1975   DATE OF ADMISSION:  10/14/2005  DATE OF DISCHARGE:  10/15/2005                                 DISCHARGE SUMMARY   The patient is a 36 year old nulligravida, black female from [place] who  underwent myomectomy five years ago.  She was first seen in the MAU at nine  weeks gestation because of post coital spotting and this ultrasound at that  time showed a nine-plus week intrauterine pregnancy with some small  leiomyomata uteri, the largest being about 3 cm in diameter.  She returns at  14+ weeks to the MAU with heavy vaginal bleeding and cervix opened with  tissue visible at the os.  The uterus now about a 14-16-week size.  Ultrasound showed a complete abortion but they could not do a vaginal probe  because of the pain of the patient and I thought probably that it was just a  bad ultrasound reading.  Apparently was wrong because we admitted the  patient and used Cytotec to evacuate the uterine contents and its inevitable  abortion and very little or no tissue passed.  I took the patient to the  operating room and found that the large mass was uterine fibroids, quickly  stimulated by her pregnancy.  The uterine cavity only sounded to 7-8 cm in  depth and she had passed most of the pregnancy.  Earlier in the day the  resident was talking to the patient and she stated she thought she had  passed the fetus prior to coming in. She had not told that to the nurse on  admission or myself.   The plan is to get an ultrasound which we will be able to obtain by the time  she comes back to the clinic in two weeks.  She has been given discharge  instructions as to her activity and followup.   DISCHARGE DIAGNOSES:  1.  Incomplete abortion.  2.  Large  uterine fibroids.           ______________________________  Javier Glazier Okey Dupre, M.D.     PDR/MEDQ  D:  10/15/2005  T:  10/16/2005  Job:  [id number]

## 2011-04-22 NOTE — Group Therapy Note (Signed)
NAMEJOVIE, Tonya Stokes           ACCOUNT NO.:  [id number]   MEDICAL RECORD NO.:  [id number]          PATIENT TYPE:  WOC   LOCATION:  [facility]                   FAC[facility]   PHYSICIAN:  Kathlyn Sacramento, M.D.   DATE OF BIRTH:  February 13, 1976   DATE OF SERVICE:  12/29/2005                                    CLINIC NOTE   CHIEF COMPLAINT:  Follow-up for spontaneous AB.   HISTORY OF PRESENT ILLNESS:  Patient is a 35 year old G1, P0-0-1-0 who had  her last menstrual period in August of 2006.  She was seen in MAU on  October 15, 2005 with an inevitable AB and had a D&C which at the time she  was noted to have a large fibroid in her uterus.  She states that since the  D&E her bleeding has decreased.  She has been spotting daily until one week  ago but now she is no longer bleeding.  She denies any dizziness.  Ultrasound report:  Please see chart for more details but the report is  significant for multiple small fibroids, largest 2.8 x 3.2 x 3.5 in size.   ALLERGIES:  No known drug allergies.   Last Pap smear August 2005.  Last breast examination this year.   CURRENT MEDICATIONS:  None.   PHYSICAL EXAMINATION:  VITAL SIGNS:  Temperature 98.9, pulse 88, blood  pressure 114/70, weight 177.1, height 5 feet 5 inches.  GENERAL:  Well-developed, well-nourished female in no acute distress.  GENITOURINARY:  Normal external genitalia.  Vagina is pink and rugated.  Cervix is normal-appearing.  Appears nulliparous.  Uterus is approximately  15 weeks size.  She has no cervical motion tenderness.  No adnexal mass or  fullness.   IMPRESSION:  1.  Fibroids.  2.  Follow-up spontaneous abortion.   PLAN:  Patient is to monitor her __________  has had no history of abnormal  bleeding associated with the fibroids.  Abnormal bleeding associated with  her spontaneous AB.  She was instructed to wait at least three months before  attempting a pregnancy in light of this.  1.  Fibroid.  Patient is just  to monitor her bleeding.  If she were to have      another spontaneous AB consider doing further work-up of her fibroids as      she is not having abnormal bleeding.  2.  History of spontaneous AB.  Patient was instructed to not attempt      pregnancy until three months after spontaneous AB.           ______________________________  Kathlyn Sacramento, M.D.     AC/MEDQ  D:  12/29/2005  T:  12/30/2005  Job:  [id number]

## 2011-04-22 NOTE — Group Therapy Note (Signed)
NAMEMarland Stokes  Tonya, Stokes NO.:  [id number]   MEDICAL RECORD NO.:  [id number]          PATIENT TYPE:  WOC   LOCATION:  [facility]                   FACILITY:  [facility]   PHYSICIAN:  Dorthula Perfect, MD     DATE OF BIRTH:  02/19/1976   DATE OF SERVICE:  01/17/2007                                  CLINIC NOTE   This 35 year old African-American female gravida 1, abortus 1 is seen  for annual Pap smear.  In January of 2007 she was seen for a followup  with a spontaneous abortion.  Her last menstrual period was December 16.  She has a history of cyclic menses.  A vaginal ultrasound done December  2006 revealed a uterus with multiple fibroids.  The largest 1 was  2.8x3.2x3.5 cm.   ALLERGIES:  None.   PHYSICAL EXAM:  Height 5 feet 4 inches, weight 186, blood pressure  119/74.  ABDOMEN:  Soft and nontender.  No masses are felt.  PELVIC:  External genitalia and BUS glands were normal.  The cervix is  normal.  The uterus is upper limits of normal size to perhaps 6 to 8  weeks' size.  Adnexal areas are normal.   IMPRESSION:  1. Uterine fibroids.  2. Pregnancy, 8 weeks 3 days by date.   DISPOSITION:  1. Pap smear.  2. Vaginal ultrasound for viability and dating, and to ascertain the      status of her fibroids will be ordered.  3. She needs a new OB appointment in 4 weeks.           ______________________________  Dorthula Perfect, MD     ER/MEDQ  D:  01/17/2007  T:  01/17/2007  Job:  [id number]

## 2011-04-22 NOTE — Op Note (Signed)
NAME:  Tonya Stokes, Tonya Stokes               ACCOUNT NO.:  `   MEDICAL RECORD NO.:  [id number]          PATIENT TYPE:  INP   LOCATION:  9371                          FACILITY:  [facility]   PHYSICIAN:  Phil D. Okey Dupre, M.D.     DATE OF BIRTH:  06-02-1976   DATE OF PROCEDURE:  10/15/2005  DATE OF DISCHARGE:  10/15/2005                                 OPERATIVE REPORT   OPERATION/PROCEDURE:  Dilation and evacuation.   PREOPERATIVE DIAGNOSIS:  Inevitable abortion.   POSTOPERATIVE DIAGNOSES:  1.  Incomplete abortion.  2.  Uterine fibroids.   SURGEON:  Javier Glazier. Okey Dupre, M.D.   ANESTHESIA:  MAC with sedation.   PATHOLOGY:  Products of conception sent for pathological diagnosis.   ESTIMATED BLOOD LOSS:  Minimal.   POSTOPERATIVE CONDITION:  Satisfactory.   OPERATIVE FINDINGS:  Examination under anesthesia:  The patient had a cervix  that was dilated 3 cm with products of conception palpated within it  cervical os.  The uterus was about 16-weeks gestational size, freely movable  and firm.  There were blood and clots in the vagina.   DESCRIPTION OF PROCEDURE:  Under satisfactory MAC analgesia, with the  patient in the dorsal lithotomy position, weighted speculum placed in the  posterior fourchette of the vagina after the vagina had been prepped and  draped in the usual sterile manner.  Anterior lip of the cervix grasped with  the ring forceps.  Uterine cavity sounded to 8 cm in depth.  Cervical os was  already dilated.  The ring forceps was used to evacuate some of the uterine  contents as well as a #10 suction curet.  This was followed by curettage  with a small serrated curet.  The uterine cavity seemed regular with no  abnormalities noted in spite of the large size leiomyomata uteri.  The  patient tolerated the procedure well with a minimal blood loss.  Forceps  were removed from the vagina.  The patient transferred to the recovery room  in satisfactory condition.     ______________________________  Javier Glazier Okey Dupre, M.D.     PDR/MEDQ  D:  10/15/2005  T:  10/16/2005  Job:  [id number]

## 2011-04-22 NOTE — Group Therapy Note (Signed)
NAME:  Tonya Stokes, Tonya Stokes NO.:  [id number]   MEDICAL RECORD NO.:  [id number]          PATIENT TYPE:  WOC   LOCATION:  [facility]                   FACILITY:  [facility]   PHYSICIAN:  Argentina Donovan, MD        DATE OF BIRTH:  Apr 17, 1976   DATE OF SERVICE:                                    CLINIC NOTE   The patient is a 35 year old gravida 1, para 0-0-1-0, who had her last  normal menstrual period in August of 2006.  She came in with an inevitable  abortion at [facility] in early November, and on October 15, 2005, had a D&E, at  which time it was noticed that she had large fibroids and a uterus about 16  weeks' size with an endometrial cavity for a pregnancy about 8 to 10 weeks.  The patient has continued to have spotting or bleeding every day since that  time and is on ibuprofen for cramping.  We are going to get a CBC to make  sure she is not very anemic, and we are going to get an ultrasound to  evaluate the size of her fibroids and the endometrial cavity to make sure  there are no retained products and then discuss with her the possibility of  myomectomy or uterine artery embolization, as she may be considering another  pregnancy.  She will be in, in two days for re-evaluation and consultation.           ______________________________  Argentina Donovan, MD     PR/MEDQ  D:  11/15/2005  T:  11/15/2005  Job:  [id number]

## 2011-09-15 LAB — RPR: RPR Ser Ql: NONREACTIVE

## 2011-09-15 LAB — CBC
HCT: 30.4 — ABNORMAL LOW
MCHC: 34.4
MCV: 89.5
Platelets: 145 — ABNORMAL LOW
RBC: 4.08
RDW: 15 — ABNORMAL HIGH
RDW: 15.4 — ABNORMAL HIGH
WBC: 12.6 — ABNORMAL HIGH

## 2012-06-26 ENCOUNTER — Other Ambulatory Visit: Payer: Self-pay | Admitting: Obstetrics and Gynecology

## 2012-06-26 DIAGNOSIS — N63 Unspecified lump in unspecified breast: Secondary | ICD-10-CM

## 2012-07-03 ENCOUNTER — Other Ambulatory Visit: Payer: Medicaid Other

## 2012-07-20 ENCOUNTER — Ambulatory Visit
Admission: RE | Admit: 2012-07-20 | Discharge: 2012-07-20 | Disposition: A | Discharge status: Home / Self Care | Payer: 59 | Source: Ambulatory Visit | Attending: Obstetrics and Gynecology | Admitting: Obstetrics and Gynecology

## 2012-07-20 ENCOUNTER — Ambulatory Visit
Admission: RE | Admit: 2012-07-20 | Discharge: 2012-07-20 | Disposition: A | Discharge status: Home / Self Care | Payer: 59 | Source: Ambulatory Visit | Attending: Obstetrics | Admitting: Obstetrics

## 2012-07-20 ENCOUNTER — Other Ambulatory Visit: Payer: Self-pay | Admitting: Obstetrics

## 2012-07-20 ENCOUNTER — Other Ambulatory Visit: Payer: Self-pay | Admitting: Obstetrics and Gynecology

## 2012-07-20 DIAGNOSIS — N63 Unspecified lump in unspecified breast: Secondary | ICD-10-CM

## 2012-07-20 DIAGNOSIS — O24919 Unspecified diabetes mellitus in pregnancy, unspecified trimester: Secondary | ICD-10-CM

## 2014-01-26 ENCOUNTER — Emergency Department (HOSPITAL_COMMUNITY)
Admission: EM | Admit: 2014-01-26 | Discharge: 2014-01-26 | Disposition: A | Discharge status: Home / Self Care | Payer: No Typology Code available for payment source | Attending: Emergency Medicine | Admitting: Emergency Medicine

## 2014-01-26 ENCOUNTER — Encounter (HOSPITAL_COMMUNITY): Payer: Self-pay | Admitting: Emergency Medicine

## 2014-01-26 ENCOUNTER — Emergency Department (HOSPITAL_COMMUNITY): Payer: No Typology Code available for payment source

## 2014-01-26 DIAGNOSIS — Y990 Civilian activity done for income or pay: Secondary | ICD-10-CM | POA: Insufficient documentation

## 2014-01-26 DIAGNOSIS — Y9289 Other specified places as the place of occurrence of the external cause: Secondary | ICD-10-CM | POA: Insufficient documentation

## 2014-01-26 DIAGNOSIS — X503XXA Overexertion from repetitive movements, initial encounter: Secondary | ICD-10-CM | POA: Insufficient documentation

## 2014-01-26 DIAGNOSIS — Y9389 Activity, other specified: Secondary | ICD-10-CM | POA: Insufficient documentation

## 2014-01-26 DIAGNOSIS — S39012A Strain of muscle, fascia and tendon of lower back, initial encounter: Secondary | ICD-10-CM | POA: Diagnosis present

## 2014-01-26 DIAGNOSIS — S239XXA Sprain of unspecified parts of thorax, initial encounter: Secondary | ICD-10-CM | POA: Insufficient documentation

## 2014-01-26 LAB — BASIC METABOLIC PANEL
BUN: 7 mg/dL (ref 6–23)
CALCIUM: 9.1 mg/dL (ref 8.4–10.5)
CO2: 25 meq/L (ref 19–32)
CREATININE: 0.69 mg/dL (ref 0.50–1.10)
Chloride: 103 mEq/L (ref 96–112)
GFR calc Af Amer: >90 mL/min (ref >90)
GFR calc non Af Amer: >90 mL/min (ref >90)
Glucose, Bld: 137 mg/dL — ABNORMAL HIGH (ref 70–99)
Potassium: 3.8 mEq/L (ref 3.7–5.3)
Sodium: 140 mEq/L (ref 137–147)

## 2014-01-26 LAB — CBC
HCT: 35.8 % — ABNORMAL LOW (ref 36.0–46.0)
Hemoglobin: 12.2 g/dL (ref 12.0–15.0)
MCH: 28 pg (ref 26.0–34.0)
MCHC: 34.1 g/dL (ref 30.0–36.0)
MCV: 82.1 fL (ref 78.0–100.0)
PLATELETS: 180 10*3/uL (ref 150–400)
RBC: 4.36 MIL/uL (ref 3.87–5.11)
RDW: 13.8 % (ref 11.5–15.5)
WBC: 6.8 10*3/uL (ref 4.0–10.5)

## 2014-01-26 LAB — I-STAT TROPONIN, ED: TROPONIN I, POC: 0 ng/mL (ref 0.00–0.08)

## 2014-01-26 MED ORDER — OXYCODONE-ACETAMINOPHEN 5-325 MG PO TABS
1.0000 | ORAL_TABLET | Freq: Once | ORAL | Status: AC
  Administered 2014-01-26: 1 via ORAL
  Filled 2014-01-26: qty 1

## 2014-01-26 MED ORDER — OXYCODONE-ACETAMINOPHEN 5-325 MG PO TABS: 1.0000 | ORAL_TABLET | Freq: Four times a day (QID) | ORAL | Status: DC | PRN

## 2014-01-26 MED ORDER — HYDROMORPHONE HCL PF 1 MG/ML IJ SOLN
1.0000 mg | Freq: Once | INTRAMUSCULAR | Status: AC
  Administered 2014-01-26: 1 mg via INTRAMUSCULAR
  Filled 2014-01-26: qty 1

## 2014-01-26 NOTE — ED Notes (Signed)
Pt reports onset yesterday with right back pain that radiates to right chest. Pt denies recent injury. Pt took tylenol today and ibuprofen yesterday.

## 2014-01-26 NOTE — ED Notes (Signed)
Heating pad given for mid back.

## 2014-01-26 NOTE — ED Provider Notes (Signed)
CSN: [id number]     Arrival date & time 01/26/14  1324 History   First MD Initiated Contact with Patient 01/26/14 1351     Chief Complaint  Patient presents with  . Chest Pain     (Consider location/radiation/quality/duration/timing/severity/associated sxs/prior Treatment) Patient is a 38 y.o. female presenting with back pain. The history is provided by the patient.  Back Pain Pain location: Right paraspinal thoracic area. Quality:  Aching Radiates to: chest. Pain severity:  Moderate Pain is:  Same all the time Onset quality:  Gradual Duration:  3 days Timing:  Constant Progression:  Worsening Chronicity:  New Context comment:  Began insidiously several days ago Relieved by:  Nothing Worsened by:  Deep breathing and palpation (Rotation of torso.) Ineffective treatments:  NSAIDs Associated symptoms: no abdominal pain, no chest pain, no dysuria, no fever and no headaches     History reviewed. No pertinent past medical history. Past Surgical History  Procedure Laterality Date  . Cesarean section     No family history on file. History  Substance Use Topics  . Smoking status: Never Smoker   . Smokeless tobacco: Not on file  . Alcohol Use: No   OB History   Grav Para Term Preterm Abortions TAB SAB Ect Mult Living                 Review of Systems  Constitutional: Negative for fever and fatigue.  HENT: Negative for congestion and drooling.   Eyes: Negative for pain.  Respiratory: Negative for cough and shortness of breath.   Cardiovascular: Negative for chest pain.  Gastrointestinal: Negative for nausea, vomiting, abdominal pain and diarrhea.  Genitourinary: Negative for dysuria and hematuria.  Musculoskeletal: Positive for back pain. Negative for gait problem and neck pain.  Skin: Negative for color change.  Neurological: Negative for dizziness and headaches.  Hematological: Negative for adenopathy.  Psychiatric/Behavioral: Negative for behavioral problems.  All  other systems reviewed and are negative.      Allergies  Review of patient's allergies indicates no known allergies.  Home Medications   Current Outpatient Rx  Name  Route  Sig  Dispense  Refill  . acetaminophen (TYLENOL) 500 MG tablet   Oral   Take 500 mg by mouth every 6 (six) hours as needed for mild pain.         Marland Kitchen ibuprofen (ADVIL,MOTRIN) 200 MG tablet   Oral   Take 200 mg by mouth every 6 (six) hours as needed for mild pain.          BP 149/106  Pulse 91  Temp(Src) 97.5 F (36.4 C) (Oral)  Resp 20  Ht 5\' 4"  (1.626 m)  Wt 195 lb (88.451 kg)  BMI 33.46 kg/m2  SpO2 96%  LMP 01/08/2014 Physical Exam  Nursing note and vitals reviewed. Constitutional: She is oriented to person, place, and time. She appears well-developed and well-nourished.  HENT:  Head: Normocephalic.  Mouth/Throat: Oropharynx is clear and moist. No oropharyngeal exudate.  Eyes: Conjunctivae and EOM are normal. Pupils are equal, round, and reactive to light.  Neck: Normal range of motion. Neck supple.  Cardiovascular: Normal rate, regular rhythm, normal heart sounds and intact distal pulses.  Exam reveals no gallop and no friction rub.   No murmur heard. Pulmonary/Chest: Effort normal and breath sounds normal. No respiratory distress. She has no wheezes.  Abdominal: Soft. Bowel sounds are normal. There is no tenderness. There is no rebound and no guarding.  Musculoskeletal: Normal range of motion. She  exhibits tenderness (focal tenderness to palpation of the right infrascapular area. The patient has reproduction of her pain with rotation of the torso and of the right shoulder.). She exhibits no edema.  Neurological: She is alert and oriented to person, place, and time.  Skin: Skin is warm and dry.  Psychiatric: She has a normal mood and affect. Her behavior is normal.    ED Course  Procedures (including critical care time) Labs Review Labs Reviewed  CBC - Abnormal; Notable for the following:     HCT 35.8 (*)    All other components within normal limits  BASIC METABOLIC PANEL - Abnormal; Notable for the following:    Glucose, Bld 137 (*)    All other components within normal limits  I-STAT TROPOININ, ED   Imaging Review Dg Chest 2 View (if Patient Has Fever And/or Copd)  01/26/2014   CLINICAL DATA:  Upper mid that stepping pain, anterior chest pain  EXAM: CHEST  2 VIEW  COMPARISON:  None  FINDINGS: Borderline enlargement of cardiac silhouette.  Mediastinal contours and pulmonary vascularity normal.  Peribronchial thickening without gross infiltrate, pleural effusion or pneumothorax.  No acute osseous findings.  IMPRESSION: Borderline enlargement of cardiac silhouette.  Minimal bronchitic changes.   Electronically Signed   By: Ulyses Southward M.D.   On: 01/26/2014 14:41    EKG Interpretation    Date/Time:  Sunday January 26 2014 13:27:56 EST Ventricular Rate:  94 PR Interval:  146 QRS Duration: 76 QT Interval:  382 QTC Calculation: 477 R Axis:   32 Text Interpretation:  Normal sinus rhythm Nonspecific T wave abnormality Prolonged QT Confirmed by Shauntavia Brackin  MD, Dam Ashraf ([id number]) on 01/26/2014 3:30:54 PM            MDM   Final diagnoses:  Back strain    2:18 PM 38 y.o. female who presents with right infrascapular pain which radiates to her chest which began 3 days ago. 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 It is suspected that she may have strained her back doing so. She denies any shortness of breath but states that   she does have mild worsening of pain with deep inspiration. She also has worsening of pain with rotation of the torso. No RF's for cardiac disease. Well's/Perc neg. she has focal tenderness to palpation of the right infrascapular area. Likely musculoskeletal cause. Will give Percocet for pain. screening labs and imaging sent.    I interpreted/reviewed the labs and/or imaging which were non-contributory.  Gave 1mg  dilaudid IM,  pain significantly improved. Low risk for MACE per HEART score. Will recommend rest/pain control and return for any worsening.  I have discussed the diagnosis/risks/treatment options with the patient and believe the pt to be eligible for discharge home to follow-up with and establish with a pcp. We also discussed returning to the ED immediately if new or worsening sx occur. We discussed the sx which are most concerning (e.g., sob, worsening pain, fever) that necessitate immediate return. Medications administered to the patient during their visit and any new prescriptions provided to the patient are listed below.  Medications given during this visit Medications  oxyCODONE-acetaminophen (PERCOCET/ROXICET) 5-325 MG per tablet 1 tablet (1 tablet Oral Given 01/26/14 1506)  HYDROmorphone (DILAUDID) injection 1 mg (1 mg Intramuscular Given 01/26/14 1602)    Discharge Medication List as of 01/26/2014  4:25 PM    START taking these medications   Details  oxyCODONE-acetaminophen (PERCOCET) 5-325 MG per tablet Take  1 tablet by mouth every 6 (six) hours as needed for moderate pain., Starting 01/26/2014, Until Discontinued, Print          Junius ArgyleForrest S Channa Hazelett, MD 01/27/14 1235

## 2014-01-26 NOTE — ED Notes (Signed)
Family at bedside. 

## 2014-01-26 NOTE — Discharge Instructions (Signed)
Back Pain, Adult  Back pain is very common. The pain often gets better over time. The cause of back pain is usually not dangerous. Most people can learn to manage their back pain on their own.   HOME CARE   · Stay active. Start with short walks on flat ground if you can. Try to walk farther each day.  · Do not sit, drive, or stand in one place for more than 30 minutes. Do not stay in bed.  · Do not avoid exercise or work. Activity can help your back heal faster.  · Be careful when you bend or lift an object. Bend at your knees, keep the object close to you, and do not twist.  · Sleep on a firm mattress. Lie on your side, and bend your knees. If you lie on your back, put a pillow under your knees.  · Only take medicines as told by your doctor.  · Put ice on the injured area.  · Put ice in a plastic bag.  · Place a towel between your skin and the bag.  · Leave the ice on for 15-20 minutes, 03-04 times a day for the first 2 to 3 days. After that, you can switch between ice and heat packs.  · Ask your doctor about back exercises or massage.  · Avoid feeling anxious or stressed. Find good ways to deal with stress, such as exercise.  GET HELP RIGHT AWAY IF:   · Your pain does not go away with rest or medicine.  · Your pain does not go away in 1 week.  · You have new problems.  · You do not feel well.  · The pain spreads into your legs.  · You cannot control when you poop (bowel movement) or pee (urinate).  · Your arms or legs feel weak or lose feeling (numbness).  · You feel sick to your stomach (nauseous) or throw up (vomit).  · You have belly (abdominal) pain.  · You feel like you may pass out (faint).  MAKE SURE YOU:   · Understand these instructions.  · Will watch your condition.  · Will get help right away if you are not doing well or get worse.  Document Released: 05/09/2008 Document Revised: 02/13/2012 Document Reviewed: 04/11/2011  Exit[employer]® Patient Information ©2014 [employer].

## 2014-12-23 ENCOUNTER — Other Ambulatory Visit: Payer: Self-pay

## 2014-12-23 DIAGNOSIS — Z1231 Encounter for screening mammogram for malignant neoplasm of breast: Secondary | ICD-10-CM

## 2016-02-17 ENCOUNTER — Encounter (HOSPITAL_COMMUNITY): Payer: Self-pay | Admitting: Family Medicine

## 2016-02-17 ENCOUNTER — Emergency Department (HOSPITAL_COMMUNITY)
Admission: EM | Admit: 2016-02-17 | Discharge: 2016-02-17 | Disposition: A | Discharge status: Home / Self Care | Payer: Self-pay | Attending: Emergency Medicine | Admitting: Emergency Medicine

## 2016-02-17 ENCOUNTER — Emergency Department (HOSPITAL_COMMUNITY): Payer: Self-pay

## 2016-02-17 DIAGNOSIS — Y9289 Other specified places as the place of occurrence of the external cause: Secondary | ICD-10-CM | POA: Insufficient documentation

## 2016-02-17 DIAGNOSIS — Y9389 Activity, other specified: Secondary | ICD-10-CM | POA: Insufficient documentation

## 2016-02-17 DIAGNOSIS — S29012A Strain of muscle and tendon of back wall of thorax, initial encounter: Secondary | ICD-10-CM | POA: Insufficient documentation

## 2016-02-17 DIAGNOSIS — R0789 Other chest pain: Secondary | ICD-10-CM | POA: Insufficient documentation

## 2016-02-17 DIAGNOSIS — X58XXXA Exposure to other specified factors, initial encounter: Secondary | ICD-10-CM | POA: Insufficient documentation

## 2016-02-17 DIAGNOSIS — S39012A Strain of muscle, fascia and tendon of lower back, initial encounter: Secondary | ICD-10-CM

## 2016-02-17 DIAGNOSIS — Y998 Other external cause status: Secondary | ICD-10-CM | POA: Insufficient documentation

## 2016-02-17 LAB — BASIC METABOLIC PANEL
ANION GAP: 11 (ref 5–15)
BUN: 9 mg/dL (ref 6–20)
CO2: 22 mmol/L (ref 22–32)
Calcium: 9.7 mg/dL (ref 8.9–10.3)
Chloride: 108 mmol/L (ref 101–111)
Creatinine, Ser: 0.89 mg/dL (ref 0.44–1.00)
GLUCOSE: 125 mg/dL — AB (ref 65–99)
POTASSIUM: 3.8 mmol/L (ref 3.5–5.1)
Sodium: 141 mmol/L (ref 135–145)

## 2016-02-17 LAB — CBC
HCT: 38.4 % (ref 36.0–46.0)
HEMOGLOBIN: 12.9 g/dL (ref 12.0–15.0)
MCH: 28.9 pg (ref 26.0–34.0)
MCHC: 33.6 g/dL (ref 30.0–36.0)
MCV: 85.9 fL (ref 78.0–100.0)
Platelets: 173 10*3/uL (ref 150–400)
RBC: 4.47 MIL/uL (ref 3.87–5.11)
RDW: 12.8 % (ref 11.5–15.5)
WBC: 4.8 10*3/uL (ref 4.0–10.5)

## 2016-02-17 LAB — I-STAT TROPONIN, ED: Troponin i, poc: 0 ng/mL (ref 0.00–0.08)

## 2016-02-17 MED ORDER — METHOCARBAMOL 500 MG PO TABS: 500.0000 mg | ORAL_TABLET | Freq: Two times a day (BID) | ORAL | Status: DC | PRN

## 2016-02-17 MED ORDER — IBUPROFEN 600 MG PO TABS: 600.0000 mg | ORAL_TABLET | Freq: Four times a day (QID) | ORAL | Status: DC | PRN

## 2016-02-17 MED ORDER — KETOROLAC TROMETHAMINE 60 MG/2ML IM SOLN
30.0000 mg | Freq: Once | INTRAMUSCULAR | Status: AC
Start: 2016-02-17 — End: 2016-02-17
  Administered 2016-02-17: 30 mg via INTRAMUSCULAR
  Filled 2016-02-17: qty 2

## 2016-02-17 NOTE — Discharge Instructions (Signed)
1. Medications: Robaxin is your muscle relaxer, take this as needed - This can make you very drowsy - please do not drink or drive on this medication, ibuprofen as needed for pain, continue usual home medications 2. Treatment: rest, drink plenty of fluids.  3. Follow Up: The cardiologist (heart doctor) should be calling you to schedule a follow up appointment. If they do not call in 1 week, please call them to schedule this appointment. The address and phone number are listed. Please return to the ER for worsening chest pain, shortness of breath, new or worsening symptoms, any additional concerns.

## 2016-02-17 NOTE — ED Notes (Signed)
EKG completed in triage.

## 2016-02-17 NOTE — ED Provider Notes (Signed)
CSN: [id number]     Arrival date & time 02/17/16  4098 History   First MD Initiated Contact with Patient 02/17/16 1007     Chief Complaint  Patient presents with  . Chest Pain     (Consider location/radiation/quality/duration/timing/severity/associated sxs/prior Treatment) Patient is a 40 y.o. female presenting with chest pain. The history is provided by the patient and medical records. No language interpreter was used.  Chest Pain Associated symptoms: back pain   Associated symptoms: no abdominal pain, no cough, no fever, no headache, no nausea, no palpitations, no shortness of breath, not vomiting and no weakness    Tonya Stokes is a 40 y.o. female  with no pertinent PMH who presents to the Emergency Department complaining of intermittent, sharp, persistent central chest pain which radiates to right breast x 1 month, worsening since yesterday. Pain is worse with movement, especially torso rotation. Alleviated with ibuprofen. Associated symptoms include right upper back pain. + recent increase in activity. Denies fever, shortness of breath, cough/congestion, recent illness.   History reviewed. No pertinent past medical history. Past Surgical History  Procedure Laterality Date  . Cesarean section     History reviewed. No pertinent family history. Social History  Substance Use Topics  . Smoking status: Never Smoker   . Smokeless tobacco: None  . Alcohol Use: No   OB History    No data available     Review of Systems  Constitutional: Negative for fever and chills.  HENT: Negative for congestion and sore throat.   Eyes: Negative for visual disturbance.  Respiratory: Negative for cough and shortness of breath.   Cardiovascular: Positive for chest pain. Negative for palpitations and leg swelling.  Gastrointestinal: Negative for nausea, vomiting and abdominal pain.  Genitourinary: Negative for dysuria.  Musculoskeletal: Positive for back pain.  Skin: Negative for rash.    Neurological: Negative for weakness and headaches.      Allergies  Review of patient's allergies indicates no known allergies.  Home Medications   Prior to Admission medications   Medication Sig Start Date End Date Taking? Authorizing Provider  acetaminophen (TYLENOL) 500 MG tablet Take 500 mg by mouth every 6 (six) hours as needed for mild pain.    Historical Provider, MD  ibuprofen (ADVIL,MOTRIN) 600 MG tablet Take 1 tablet (600 mg total) by mouth every 6 (six) hours as needed. 02/17/16   Chase Picket Zane Pellecchia, PA-C  methocarbamol (ROBAXIN) 500 MG tablet Take 1 tablet (500 mg total) by mouth 2 (two) times daily as needed for muscle spasms. 02/17/16   Chase Picket Harun Brumley, PA-C  oxyCODONE-acetaminophen (PERCOCET) 5-325 MG per tablet Take 1 tablet by mouth every 6 (six) hours as needed for moderate pain. 01/26/14   Purvis Sheffield, MD   BP 156/81 mmHg  Pulse 82  Temp(Src) 97.6 F (36.4 C) (Oral)  Resp 18  SpO2 97%  LMP 02/01/2016 Physical Exam  Constitutional: She is oriented to person, place, and time. She appears well-developed and well-nourished.  Alert and in no acute distress  HENT:  Head: Normocephalic and atraumatic.  Cardiovascular: Normal rate, regular rhythm and normal heart sounds.  Exam reveals no gallop and no friction rub.   No murmur heard. Pulmonary/Chest: Effort normal and breath sounds normal. No respiratory distress. She has no wheezes. She has no rales.    Abdominal: Soft. Bowel sounds are normal. She exhibits no distension and no mass. There is no tenderness. There is no rebound and no guarding.  Musculoskeletal:  Arms: Back with full ROM. Pain reproducible with palpation as depicted in image and with torso rotation. No midline tenderness. No overlying skin changes appreciated.   Neurological: She is alert and oriented to person, place, and time.  Skin: Skin is warm and dry.  Psychiatric: She has a normal mood and affect. Her behavior is normal. Judgment  and thought content normal.  Nursing note and vitals reviewed.   ED Course  Procedures (including critical care time) Labs Review Labs Reviewed  BASIC METABOLIC PANEL - Abnormal; Notable for the following:    Glucose, Bld 125 (*)    All other components within normal limits  CBC  I-STAT TROPOININ, ED    Imaging Review Dg Chest 2 View  02/17/2016  CLINICAL DATA:  Right-sided chest pain for several days, progressing EXAM: CHEST  2 VIEW COMPARISON:  January 26, 2014 FINDINGS: There is no edema or consolidation. The heart size and pulmonary vascularity are normal. No adenopathy. No pneumothorax. No bone lesions. IMPRESSION: No edema or consolidation. Electronically Signed   By: Bretta BangWilliam  Woodruff III M.D.   On: 02/17/2016 09:26   I have personally reviewed and evaluated these images and lab results as part of my medical decision-making.   EKG Interpretation   Date/Time:  Wednesday February 17 2016 09:05:09 EDT Ventricular Rate:  82 PR Interval:  148 QRS Duration: 74 QT Interval:  350 QTC Calculation: 408 R Axis:   33 Text Interpretation:  Normal sinus rhythm Nonspecific T wave abnormality  Abnormal ECG Confirmed by Lincoln Brighamees, Liz [phone]([id number]) on 02/17/2016 9:18:06 AM      MDM   Final diagnoses:  Atypical chest pain  Back strain, initial encounter   Tonya FlakeSylvia Knapik presents with chest pain that radiates to right breast and associated right back pain x 1 month. Worse with movement and palpation. Improved with ibuprofen at home. Likely musk etiology, however patient does have an EKG with nonspecific t wave changes. Appears unchanged from prior EKG from 2015. Cardiology was consulted in hopes of scheduling an outpatient follow up appointment for patient and has agreed to call patient for scheduling of follow up appt.   Patient is to be discharged with recommendation to follow up with cardiology in regards to today's hospital visit. Patient's symptoms unlikely to be of CAD etiology. HEART  score of 1 which is low risk. PERC negative. Labs and imaging reviewed again prior to dc. CXR wdl and neg troponin. Pt has been advised return to the ED if develop any exertional chest pain- strict return precautions discussed and all questions answered. Patient appears reliable for follow up and is agreeable to plan as dictated above.   Patient discussed with Dr. Madilyn Hookees who agrees with treatment plan.   [facility] Surgery Center LPJaime Pilcher Navada Osterhout, PA-C 02/17/16 1117  Tilden FossaElizabeth Rees, MD 02/18/16 [phone]

## 2016-02-17 NOTE — ED Notes (Signed)
Pt here for right breast pain and into her chest. sts worsening since yesterday. Denies any N,V,SOB.

## 2016-02-17 NOTE — Progress Notes (Signed)
Spoke to patient regarding primary care resources and the [facility]. Appointment made to establish to primary care with [facility] for Friday March 31,2017 @ 2:30pm, pt verbalized understanding. Orange card application provided and explained. Patient instructed to contact me once application is complete for an eligibility appointment. No other Community Health & Eligibility Specialist needs identified at this time.   Buddy DutyFelicia Evans [facility] for [facility] [phone]

## 2016-02-17 NOTE — ED Notes (Signed)
Social worker at bedside.

## 2016-02-17 NOTE — ED Notes (Signed)
Social worker still assisting patient.

## 2016-03-04 ENCOUNTER — Ambulatory Visit: Payer: No Typology Code available for payment source | Admitting: Family Medicine

## 2016-03-05 HISTORY — PX: TRANSTHORACIC ECHOCARDIOGRAM: SHX275

## 2016-03-11 ENCOUNTER — Encounter: Payer: Self-pay | Admitting: Internal Medicine

## 2016-03-11 ENCOUNTER — Ambulatory Visit (INDEPENDENT_AMBULATORY_CARE_PROVIDER_SITE_OTHER): Payer: No Typology Code available for payment source | Admitting: Internal Medicine

## 2016-03-11 VITALS — BP 140/96 | HR 86 | Ht 65.0 in | Wt 221.4 lb

## 2016-03-11 DIAGNOSIS — R079 Chest pain, unspecified: Secondary | ICD-10-CM

## 2016-03-11 DIAGNOSIS — I517 Cardiomegaly: Secondary | ICD-10-CM

## 2016-03-11 DIAGNOSIS — D573 Sickle-cell trait: Secondary | ICD-10-CM

## 2016-03-11 DIAGNOSIS — R011 Cardiac murmur, unspecified: Secondary | ICD-10-CM | POA: Insufficient documentation

## 2016-03-11 DIAGNOSIS — R030 Elevated blood-pressure reading, without diagnosis of hypertension: Secondary | ICD-10-CM

## 2016-03-11 DIAGNOSIS — R072 Precordial pain: Secondary | ICD-10-CM

## 2016-03-11 NOTE — Progress Notes (Signed)
OFFICE NOTE  Chief Complaint:  Chest pain  Primary Care Physician: No primary care provider on file.  HPI:  Tonya Stokes is a 40 y.o. female with few medical problems other than low back pain and sickle cell trait who is originally from the [facility]. She was recently seen in the [facility] for an episode of chest pain. She was noted to rule out for MI. In 2015 her chest x-ray showed borderline cardiomegaly however her recent chest x-ray was read as normal heart size. She says that this was a midline precordial pain that seemed to radiate to her back. It seems to be associated more with stress and feelings of anxiousness. She denies any heart racing during these episodes. It is not worse after eating or laying down. This is not worse with exertion or relieved by rest. It does not radiate to her arm or jaw. She's had no other reflux type symptoms. She reports being fairly sedentary without active exercise. Otherwise she has no significant cardiac risk factors. She denies any family history of significant heart disease except for palpitations in her mother.   PMHx:  History reviewed. No pertinent past medical history.  Past Surgical History  Procedure Laterality Date  . Cesarean section      FAMHx:  Family History  Problem Relation Age of Onset  . Irregular heart beat Mother     SOCHx:   reports that she has never smoked. She does not have any smokeless tobacco history on file. She reports that she does not drink alcohol or use illicit drugs.  ALLERGIES:  No Known Allergies  ROS: Pertinent items noted in HPI and remainder of comprehensive ROS otherwise negative.  HOME MEDS: Current Outpatient Prescriptions  Medication Sig Dispense Refill  . ibuprofen (ADVIL,MOTRIN) 600 MG tablet Take 1 tablet (600 mg total) by mouth every 6 (six) hours as needed. 30 tablet 0  . methocarbamol (ROBAXIN) 500 MG tablet Take 1 tablet (500 mg total) by mouth 2 (two) times daily as needed for  muscle spasms. 5 tablet 0   No current facility-administered medications for this visit.    LABS/IMAGING: No results found for this or any previous visit (from the past 48 hour(s)). No results found.  WEIGHTS: Wt Readings from Last 3 Encounters:  03/11/16 221 lb 6.4 oz (100.426 kg)  01/26/14 195 lb (88.451 kg)    VITALS: BP 140/96 mmHg  Pulse 86  Ht  (1.651 m)  Wt 221 lb 6.4 oz (100.426 kg)  BMI 36.84 kg/m2  LMP 02/01/2016  EXAM: General appearance: alert and no distress Neck: no carotid bruit and no JVD Lungs: clear to auscultation bilaterally Heart: regular rate and rhythm, S1, S2 normal, no murmur, click, rub or gallop Abdomen: soft, non-tender; bowel sounds normal; no masses,  no organomegaly Extremities: extremities normal, atraumatic, no cyanosis or edema Pulses: 2+ and symmetric Skin: Skin color, texture, turgor normal. No rashes or lesions Neurologic: Grossly normal Psych: Pleasant  EKG: Hospital EKG personally reviewed-normal sinus rhythm at 82 with nonspecific T-wave changes  ASSESSMENT: 1. Precordial chest pain-atypical 2. Elevated blood pressure without diagnosis of hypertension 3. Borderline cardiomegaly  PLAN: 1.   Tonya Stokes has had some x-ray evidence of borderline cardiomegaly and blood pressure which may be borderline or elevated. I cannot give her the diagnosis today however I would ask her to take blood pressures at home and keep a record as well as bringing that record back to me as follow-up. There may  be need for blood pressure medication. Her chest pain symptoms sound somewhat atypical however recommend an exercise treadmill stress test at maximum to identify possible ischemia. We'll also get an echocardiogram to evaluate heart size and LV function.  Follow-up with me after the tests are completed.  Chrystie NoseKenneth C. Phuong Hillary, MD, [facility] Attending Cardiologist [facility]  Chrystie NoseKenneth C Jalani Cullifer 03/11/2016, 11:45 AM

## 2016-03-11 NOTE — Patient Instructions (Addendum)
Your physician has requested that you have an echocardiogram @ [address]. Echocardiography is a painless test that uses sound waves to create images of your heart. It provides your doctor with information about the size and shape of your heart and how well your heart's chambers and valves are working. This procedure takes approximately one hour. There are no restrictions for this procedure.  Your physician has requested that you have an exercise tolerance test. For further information please visit [email or web address]. Please also follow instruction sheet, as given.  Please monitor your blood pressures at home and bring a list of these readings and your blood pressure cuff to your follow up visit.   Your physician recommends that you schedule a follow-up appointment after your testing.

## 2016-03-22 ENCOUNTER — Ambulatory Visit (HOSPITAL_COMMUNITY)
Admission: RE | Admit: 2016-03-22 | Discharge: 2016-03-22 | Disposition: A | Discharge status: Home / Self Care | Payer: No Typology Code available for payment source | Source: Ambulatory Visit | Attending: Cardiology | Admitting: Cardiology

## 2016-03-22 DIAGNOSIS — I071 Rheumatic tricuspid insufficiency: Secondary | ICD-10-CM | POA: Insufficient documentation

## 2016-03-22 DIAGNOSIS — I340 Nonrheumatic mitral (valve) insufficiency: Secondary | ICD-10-CM | POA: Insufficient documentation

## 2016-03-22 DIAGNOSIS — R079 Chest pain, unspecified: Secondary | ICD-10-CM | POA: Insufficient documentation

## 2016-03-22 DIAGNOSIS — I517 Cardiomegaly: Secondary | ICD-10-CM | POA: Insufficient documentation

## 2016-03-25 ENCOUNTER — Encounter: Payer: Self-pay | Admitting: Family Medicine

## 2016-03-25 ENCOUNTER — Ambulatory Visit (INDEPENDENT_AMBULATORY_CARE_PROVIDER_SITE_OTHER): Payer: No Typology Code available for payment source | Admitting: Family Medicine

## 2016-03-25 ENCOUNTER — Other Ambulatory Visit: Payer: Self-pay | Admitting: Family Medicine

## 2016-03-25 VITALS — BP 139/80 | HR 83 | Temp 98.4°F | Resp 16 | Ht 65.0 in | Wt 220.0 lb

## 2016-03-25 DIAGNOSIS — Z7189 Other specified counseling: Secondary | ICD-10-CM

## 2016-03-25 DIAGNOSIS — Z1239 Encounter for other screening for malignant neoplasm of breast: Secondary | ICD-10-CM

## 2016-03-25 DIAGNOSIS — R030 Elevated blood-pressure reading, without diagnosis of hypertension: Secondary | ICD-10-CM

## 2016-03-25 DIAGNOSIS — Z7689 Persons encountering health services in other specified circumstances: Secondary | ICD-10-CM

## 2016-03-25 DIAGNOSIS — Z23 Encounter for immunization: Secondary | ICD-10-CM

## 2016-03-25 DIAGNOSIS — Z1231 Encounter for screening mammogram for malignant neoplasm of breast: Secondary | ICD-10-CM

## 2016-03-25 LAB — LIPID PANEL
CHOL/HDL RATIO: 5.1 ratio — AB (ref <=5.0)
Cholesterol: 193 mg/dL (ref 125–200)
HDL: 38 mg/dL — AB (ref >=46)
LDL CALC: 109 mg/dL (ref <130)
TRIGLYCERIDES: 232 mg/dL — AB (ref <150)
VLDL: 46 mg/dL — ABNORMAL HIGH (ref <30)

## 2016-03-25 LAB — TSH: TSH: 4.06 m[IU]/L

## 2016-03-25 NOTE — Patient Instructions (Addendum)
Call number provided to set appointment for mammogram Make an appointment in next few weeks for PAP smear for cervical cancer screening.  Follow-up with cardiology as planned about blood pressure.

## 2016-03-25 NOTE — Progress Notes (Signed)
Patient ID: Tonya Stokes, female   DOB: May 30, 1976, 40 y.o.   MRN: [id number]   Tonya Stokes, is a 40 y.o. female  [id number]  [id number]  DOB - 28-Feb-1976  CC:  Chief Complaint  Patient presents with  . Establish Care    hospital follow up/ chest pain       HPI: Tonya Stokes is a 40 y.o. female here to establish care. She was seen in ED with non-specific chest pain (MI was ruled out). She has since followed up with cardiology and has another appointment scheduled. She was referred here to establish care. She is currently on no regular medications.There is a suggestion for cardiomegaly. Her blood pressure was elevated in cardiology office but she has not been given a diagnosis of hypertension.   Health Maintenance:  She is in need of Tdap, mammogram, and PAP smear. She denies, tobacco, alcohol or illicit drug use. She does not exercise regularly and does not follow any particular dietary plan.   No Known Allergies No past medical history on file. No current outpatient prescriptions on file prior to visit.   No current facility-administered medications on file prior to visit.   Family History  Problem Relation Age of Onset  . Irregular heart beat Mother    Social History   Social History  . Marital Status: Married    Spouse Name: N/A  . Number of Children: 2  . Years of Education: N/A   Occupational History  . CNA    Social History Main Topics  . Smoking status: Never Smoker   . Smokeless tobacco: Not on file  . Alcohol Use: No  . Drug Use: No  . Sexual Activity: Not on file   Other Topics Concern  . Not on file   Social History Narrative   epworth sleepiness scale = 5 (03/11/16)    Review of Systems: Constitutional: Negative for fever, chills, appetite change, weight loss,  Fatigue. Skin: Negative for rashes or lesions of concern. HENT: Negative for ear pain, ear discharge.nose bleeds Eyes: Negative for pain, discharge, redness, itching  and visual disturbance. Neck: Negative for pain, stiffness Respiratory: Negative for cough, shortness of breath,   Cardiovascular: Negative for chest pain, palpitations and leg swelling. Gastrointestinal: Negative for abdominal pain, nausea, vomiting, diarrhea, constipations Genitourinary: Negative for dysuria, urgency, frequency, hematuria,  Musculoskeletal: Negative for back pain, joint pain, joint  swelling, and gait problem.Negative for weakness. Neurological: Negative for dizziness, tremors, seizures, syncope,   light-headedness, numbness and headaches.  Hematological: Negative for easy bruising or bleeding Psychiatric/Behavioral: Negative for depression, anxiety, decreased concentration, confusion   Objective:   Filed Vitals:   03/25/16 1059  BP: 139/80  Pulse: 83  Temp: 98.4 F (36.9 C)  Resp: 16    Physical Exam: Constitutional: Patient appears well-developed and well-nourished. No distress. HENT: Normocephalic, atraumatic, External right and left ear normal. Oropharynx is clear and moist.  Eyes: Conjunctivae and EOM are normal. PERRLA, no scleral icterus. Neck: Normal ROM. Neck supple. No lymphadenopathy, No thyromegaly. CVS: RRR, S1/S2 +, no murmurs, no gallops, no rubs Pulmonary: Effort and breath sounds normal, no stridor, rhonchi, wheezes, rales.  Abdominal: Soft. Normoactive BS,, no distension, tenderness, rebound or guarding.  Musculoskeletal: Normal range of motion. No edema and no tenderness.  Neuro: Alert.Normal muscle tone coordination. Non-focal Skin: Skin is warm and dry. No rash noted. Not diaphoretic. No erythema. No pallor. Psychiatric: Normal mood and affect. Behavior, judgment, thought content normal.  Lab Results  Component Value Date  WBC 4.8 02/17/2016   HGB 12.9 02/17/2016   HCT 38.4 02/17/2016   MCV 85.9 02/17/2016   PLT 173 02/17/2016   Lab Results  Component Value Date   CREATININE 0.89 02/17/2016   BUN 9 02/17/2016   NA 141 02/17/2016    K 3.8 02/17/2016   CL 108 02/17/2016   CO2 22 02/17/2016    No results found for: HGBA1C Lipid Panel  No results found for: CHOL, TRIG, HDL, CHOLHDL, VLDL, LDLCALC     Assessment and plan:   1. Need for Tdap vaccination  - Tdap vaccine greater than or equal to 7yo IM  2. Elevated blood pressure (not hypertension)  - TSH - Lipid panel  3. Breast cancer screening - mammogram ordered  4. Encounter to establish care - Have reviewed information presented by the patient and pertinent information from his chart.   Return in about 1 year (around 03/25/2017).  The patient was given clear instructions to go to ER or return to medical center if symptoms don't improve, worsen or new problems develop. The patient verbalized understanding.    Henrietta HooverLinda C Bernhardt FNP  03/25/2016, 11:56 AM

## 2016-03-31 ENCOUNTER — Telehealth (HOSPITAL_COMMUNITY): Payer: Self-pay

## 2016-03-31 NOTE — Telephone Encounter (Signed)
Encounter complete. 

## 2016-04-04 HISTORY — PX: OTHER SURGICAL HISTORY: SHX169

## 2016-04-05 ENCOUNTER — Ambulatory Visit (HOSPITAL_COMMUNITY)
Admission: RE | Admit: 2016-04-05 | Discharge: 2016-04-05 | Disposition: A | Discharge status: Home / Self Care | Payer: No Typology Code available for payment source | Source: Ambulatory Visit | Attending: Cardiology | Admitting: Cardiology

## 2016-04-05 DIAGNOSIS — R079 Chest pain, unspecified: Secondary | ICD-10-CM

## 2016-04-05 LAB — EXERCISE TOLERANCE TEST
CHL CUP MPHR: 179 {beats}/min
CHL RATE OF PERCEIVED EXERTION: 17
CSEPEW: 8.5 METS
CSEPHR: 92 %
CSEPPHR: 166 {beats}/min
Exercise duration (min): 7 min
Rest HR: 80 {beats}/min

## 2016-04-06 ENCOUNTER — Encounter: Payer: Self-pay | Admitting: Family Medicine

## 2016-04-06 ENCOUNTER — Ambulatory Visit (INDEPENDENT_AMBULATORY_CARE_PROVIDER_SITE_OTHER): Payer: No Typology Code available for payment source | Admitting: Family Medicine

## 2016-04-06 VITALS — BP 135/75 | HR 89 | Temp 97.5°F | Resp 16 | Ht 65.0 in | Wt 218.0 lb

## 2016-04-06 DIAGNOSIS — Z01419 Encounter for gynecological examination (general) (routine) without abnormal findings: Secondary | ICD-10-CM

## 2016-04-06 NOTE — Progress Notes (Signed)
Patient ID: Tonya FlakeSylvia Stokes, female   DOB: 04/27/1976, 40 y.o.   MRN: [id number]   Tonya Stokes, is a 40 y.o. female  HYQ:[id number]  [id number]  DOB - 04/27/1976  CC:  Chief Complaint  Patient presents with  . Gynecologic Exam       HPI: Tonya Stokes is a 40 y.o. female presents for PAP smear. She was seen recently and other health needs addressed. She reports being sexually active. She does not use birth control. She has regular periods with excessive bleeding or pain. Her last period was on April 22. She reports no concerns about STIs. But gives permission for screening.  No Known Allergies No past medical history on file. No current outpatient prescriptions on file prior to visit.   No current facility-administered medications on file prior to visit.   Family History  Problem Relation Age of Onset  . Irregular heart beat Mother    Social History   Social History  . Marital Status: Married    Spouse Name: N/A  . Number of Children: 2  . Years of Education: N/A   Occupational History  . CNA    Social History Main Topics  . Smoking status: Never Smoker   . Smokeless tobacco: Not on file  . Alcohol Use: No  . Drug Use: No  . Sexual Activity: Not on file   Other Topics Concern  . Not on file   Social History Narrative   epworth sleepiness scale = 5 (03/11/16)    Review of Systems: Constitutional: Negative for fever, chills, appetite change, weight loss,  Fatigue. Skin: Negative for rashes or lesions of concern. HENT: Negative for ear pain, ear discharge.nose bleeds Eyes: Negative for pain, discharge, redness, itching and visual disturbance. Neck: Negative for pain, stiffness Respiratory: Negative for cough, shortness of breath,   Cardiovascular: Negative for chest pain, palpitations and leg swelling. Gastrointestinal: Negative for abdominal pain, nausea, vomiting, diarrhea, constipations Genitourinary: Negative for dysuria, urgency, frequency,  hematuria,  Musculoskeletal: Negative for back pain, joint pain, joint  swelling, and gait problem.Negative for weakness. Neurological: Negative for dizziness, tremors, seizures, syncope,   light-headedness, numbness and headaches.  Hematological: Negative for easy bruising or bleeding Psychiatric/Behavioral: Negative for depression, anxiety, decreased concentration, confusion   Objective:   Filed Vitals:   04/06/16 0826  BP: 135/75  Pulse: 89  Temp: 97.5 F (36.4 C)  Resp: 16    Physical Exam: Constitutional: Patient appears well-developed and well-nourished. No distress. HENT: Normocephalic, atraumatic, External right and left ear normal. Oropharynx is clear and moist.  Eyes: Conjunctivae and EOM are normal. PERRLA, no scleral icterus. Neck: Normal ROM. Neck supple. No lymphadenopathy, No thyromegaly. CVS: RRR, S1/S2 +, no murmurs, no gallops, no rubs Pulmonary: Effort and breath sounds normal, no stridor, rhonchi, wheezes, rales.  Abdominal: Soft. Normoactive BS,, no distension, tenderness, rebound or guarding.  Musculoskeletal: Normal range of motion. No edema and no tenderness.  Neuro: Alert.Normal muscle tone coordination. Non-focal Skin: Skin is warm and dry. No rash noted. Not diaphoretic. No erythema. No pallor. Psychiatric: Normal mood and affect. Behavior, judgment, thought content normal.  Lab Results  Component Value Date   WBC 4.8 02/17/2016   HGB 12.9 02/17/2016   HCT 38.4 02/17/2016   MCV 85.9 02/17/2016   PLT 173 02/17/2016   Lab Results  Component Value Date   CREATININE 0.89 02/17/2016   BUN 9 02/17/2016   NA 141 02/17/2016   K 3.8 02/17/2016   CL 108 02/17/2016   CO2  22 02/17/2016    No results found for: HGBA1C Lipid Panel     Component Value Date/Time   CHOL 193 03/25/2016 1127   TRIG 232* 03/25/2016 1127   HDL 38* 03/25/2016 1127   CHOLHDL 5.1* 03/25/2016 1127   VLDL 46* 03/25/2016 1127   LDLCALC 109 03/25/2016 1127       Assessment  and plan:   1. Encounter for routine gynecological examination  - Pap IG and Chlamydia/Gonococcus, NAA ([facility]) - Trichomonas vaginalis, RNA   Follow-up prn.  The patient was given clear instructions to go to ER or return to medical center if symptoms don't improve, worsen or new problems develop. The patient verbalized understanding.    Henrietta Hoover FNP  04/06/2016, 9:20 AM

## 2016-04-06 NOTE — Patient Instructions (Signed)
Follow-up as needed and as planned.

## 2016-04-07 ENCOUNTER — Other Ambulatory Visit: Payer: Self-pay | Admitting: Family Medicine

## 2016-04-07 ENCOUNTER — Other Ambulatory Visit (HOSPITAL_COMMUNITY)
Admission: RE | Admit: 2016-04-07 | Discharge: 2016-04-07 | Disposition: A | Discharge status: Home / Self Care | Payer: No Typology Code available for payment source | Source: Ambulatory Visit | Attending: Family Medicine | Admitting: Family Medicine

## 2016-04-07 DIAGNOSIS — Z01411 Encounter for gynecological examination (general) (routine) with abnormal findings: Secondary | ICD-10-CM | POA: Insufficient documentation

## 2016-04-07 DIAGNOSIS — Z124 Encounter for screening for malignant neoplasm of cervix: Secondary | ICD-10-CM

## 2016-04-07 DIAGNOSIS — Z1151 Encounter for screening for human papillomavirus (HPV): Secondary | ICD-10-CM | POA: Insufficient documentation

## 2016-04-11 LAB — CYTOLOGY - PAP

## 2016-04-12 ENCOUNTER — Ambulatory Visit
Admission: RE | Admit: 2016-04-12 | Discharge: 2016-04-12 | Disposition: A | Discharge status: Home / Self Care | Payer: No Typology Code available for payment source | Source: Ambulatory Visit | Attending: Family Medicine | Admitting: Family Medicine

## 2016-04-12 DIAGNOSIS — Z1231 Encounter for screening mammogram for malignant neoplasm of breast: Secondary | ICD-10-CM

## 2016-04-15 ENCOUNTER — Ambulatory Visit (INDEPENDENT_AMBULATORY_CARE_PROVIDER_SITE_OTHER): Payer: No Typology Code available for payment source | Admitting: Internal Medicine

## 2016-04-15 ENCOUNTER — Encounter: Payer: Self-pay | Admitting: Internal Medicine

## 2016-04-15 VITALS — BP 132/80 | HR 90 | Ht 64.0 in | Wt 221.0 lb

## 2016-04-15 DIAGNOSIS — Z79899 Other long term (current) drug therapy: Secondary | ICD-10-CM

## 2016-04-15 DIAGNOSIS — I517 Cardiomegaly: Secondary | ICD-10-CM

## 2016-04-15 DIAGNOSIS — R072 Precordial pain: Secondary | ICD-10-CM

## 2016-04-15 DIAGNOSIS — R030 Elevated blood-pressure reading, without diagnosis of hypertension: Secondary | ICD-10-CM

## 2016-04-15 DIAGNOSIS — R011 Cardiac murmur, unspecified: Secondary | ICD-10-CM

## 2016-04-15 LAB — BASIC METABOLIC PANEL
BUN: 9 mg/dL (ref 7–25)
CALCIUM: 9 mg/dL (ref 8.6–10.2)
CO2: 22 mmol/L (ref 20–31)
Chloride: 102 mmol/L (ref 98–110)
Creat: 0.71 mg/dL (ref 0.50–1.10)
GLUCOSE: 96 mg/dL (ref 65–99)
Potassium: 4 mmol/L (ref 3.5–5.3)
SODIUM: 133 mmol/L — AB (ref 135–146)

## 2016-04-15 MED ORDER — CHLORTHALIDONE 25 MG PO TABS: 12.5000 mg | ORAL_TABLET | Freq: Every day | ORAL | Status: DC

## 2016-04-15 NOTE — Patient Instructions (Addendum)
Your physician has recommended you make the following change in your medication: START chlorthalidone 12.5mg  once daily (you will cut your 25mg  in half)  Your physician recommends that you return for lab work in: ONE WEEK (BMET)  Your physician recommends that you schedule a follow-up appointment in TWO WEEKS with Belenda CruiseKristin for a blood pressure check   Your physician recommends that you schedule a follow-up appointment as needed with Dr. Rennis GoldenHilty

## 2016-04-15 NOTE — Progress Notes (Signed)
OFFICE NOTE  Chief Complaint:  Follow-up echo  Primary Care Physician: Concepcion LivingBERNHARDT, LINDA, NP  HPI:  Tonya Stokes is a 40 y.o. female with few medical problems other than low back pain and sickle cell trait who [place]. She was recently seen in the [place] for an episode of chest pain. She was noted to rule out for MI. In 2015 her chest x-ray showed borderline cardiomegaly however her recent chest x-ray was read as normal heart size. She says that this was a midline precordial pain that seemed to radiate to her back. It seems to be associated more with stress and feelings of anxiousness. She denies any heart racing during these episodes. It is not worse after eating or laying down. This is not worse with exertion or relieved by rest. It does not radiate to her arm or jaw. She's had no other reflux type symptoms. She reports being fairly sedentary without active exercise. Otherwise she has no significant cardiac risk factors. She denies any family history of significant heart disease except for palpitations in her mother.   04/15/2016  Tonya Stokes returns today for follow-up of her echo. Please reports there is no evidence of cardiomegaly. LV wall thickness is normal. LVEF was normal at 60-65% with normal LV chamber dimensions. She also underwent exercise treadmill stress testing which was negative for ischemia, however there was a hypertensive response to exercise. The suggestion may be underlying hypertension or proclivity to that. Blood pressure today is in the normal range at 132/80 however blood pressures at home she says can be in the 150s.  PMHx:  History reviewed. No pertinent past medical history.  Past Surgical History  Procedure Laterality Date  . Cesarean section  09/02/2007  . Cesarean section  02/27/2011    FAMHx:  Family History  Problem Relation Age of Onset  . Irregular heart beat Mother     SOCHx:   reports that she has never  smoked. She does not have any smokeless tobacco history on file. She reports that she does not drink alcohol or use illicit drugs.  ALLERGIES:  No Known Allergies  ROS: Pertinent items noted in HPI and remainder of comprehensive ROS otherwise negative.  HOME MEDS: Current Outpatient Prescriptions  Medication Sig Dispense Refill  . chlorthalidone (HYGROTON) 25 MG tablet Take 0.5 tablets (12.5 mg total) by mouth daily. 15 tablet 3   No current facility-administered medications for this visit.    LABS/IMAGING: No results found for this or any previous visit (from the past 48 hour(s)). No results found.  WEIGHTS: Wt Readings from Last 3 Encounters:  04/15/16 221 lb (100.245 kg)  04/06/16 218 lb (98.884 kg)  03/25/16 220 lb (99.791 kg)    VITALS: BP 132/80 mmHg  Pulse 90  Ht 5\' 4"  (1.626 m)  Wt 221 lb (100.245 kg)  BMI 37.92 kg/m2  LMP 03/26/2016  EXAM: Deferred  EKG: Deferred  ASSESSMENT: 1. Precordial chest pain - negative exercise treadmill test. 2. Elevated blood pressure without diagnosis of hypertension 3. Borderline cardiomegaly - normal echo with LVEF of 60-65%  PLAN: 1.   Tonya Stokes has no sign of cardiomegaly on echo to explain the findings on her chest x-ray. Her exercise treadmill stress test was negative for ischemia and I did not feel like her chest pain is related to coronary ischemia. It may be however related to hypertension as there was a hypertensive response to exercise. I think she benefit from treatment of  her blood pressure with a low-dose diuretic such as chlorthalidone. We'll start 12.5 mg daily. Check a metabolic profile next week to make shows no changes in potassium or creatinine.  Chrystie Nose, MD, [facility] Attending Cardiologist [facility]  Chrystie Nose 04/15/2016, 2:12 PM

## 2016-04-28 ENCOUNTER — Encounter: Payer: Self-pay | Admitting: Pharmacist

## 2016-04-28 ENCOUNTER — Ambulatory Visit (INDEPENDENT_AMBULATORY_CARE_PROVIDER_SITE_OTHER): Payer: No Typology Code available for payment source | Admitting: Pharmacist

## 2016-04-28 VITALS — BP 128/82 | Wt 217.4 lb

## 2016-04-28 DIAGNOSIS — R030 Elevated blood-pressure reading, without diagnosis of hypertension: Secondary | ICD-10-CM

## 2016-04-28 LAB — BASIC METABOLIC PANEL
BUN: 12 mg/dL (ref 7–25)
CALCIUM: 9.5 mg/dL (ref 8.6–10.2)
CO2: 29 mmol/L (ref 20–31)
Chloride: 99 mmol/L (ref 98–110)
Creat: 0.8 mg/dL (ref 0.50–1.10)
GLUCOSE: 87 mg/dL (ref 65–99)
Potassium: 3.6 mmol/L (ref 3.5–5.3)
SODIUM: 138 mmol/L (ref 135–146)

## 2016-04-28 NOTE — Assessment & Plan Note (Signed)
BP is at goal today and slightly improved after starting chlorthalidone. No medication changes. BMET today. Instructed her to call if blood pressure remain elevated or she notices a trend with pressures being elevated with exercise. Follow-up with Dr. Rennis GoldenHilty as directed.

## 2016-04-28 NOTE — Progress Notes (Signed)
Patient ID: Tonya Stokes                 DOB: [birth year]                      MRN: [id number]     HPI: Tonya Stokes is a 40 y.o. female referred by Dr. Rennis GoldenHilty to HTN clinic for a blood pressure check. She was recently started on chlorthalidone 12.5mg  daily. She reports feeling fine since starting the medication. She also reports that she did not "do her homework" and check her blood pressure/keep a log to bring to the office.  Current HTN meds:  Chlorthalidone 12.5mg  in the morning Previously tried: none BP goal: <140/90   Family History: mother had high cholesterol and palpitations; father died when she was very young from unknown causes  Social History: never smoker, denies smokeless tobacco and alcohol  Diet: eats most of her meals from home. She admits to cooking with salt. 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   Exercise: on feet at work. She admits she needs work here and is hoping to start exercising with her son.   Home BP readings: has cuff but has not checked since visit with Dr. Leta JunglingHilty  Wt Readings from Last 3 Encounters:  04/15/16 221 lb (100.245 kg)  04/06/16 218 lb (98.884 kg)  03/25/16 220 lb (99.791 kg)   BP Readings from Last 3 Encounters:  04/15/16 132/80  04/06/16 135/75  03/25/16 139/80   Pulse Readings from Last 3 Encounters:  04/15/16 90  04/06/16 89  03/25/16 83    Renal function: Estimated Creatinine Clearance: 106.5 mL/min (by C-G formula based on Cr of 0.71).  No past medical history on file.  Current Outpatient Prescriptions on File Prior to Visit  Medication Sig Dispense Refill  . chlorthalidone (HYGROTON) 25 MG tablet Take 0.5 tablets (12.5 mg total) by mouth daily. 15 tablet 3   No current facility-administered medications on file prior to visit.    No Known Allergies   Assessment/Plan: Elevated blood pressure: BP is at goal today and slightly improved after starting  chlorthalidone. No medication changes. BMET today. Instructed her to call if blood pressure remain elevated or she notices a trend with pressures being elevated with exercise. Follow-up with Dr. Rennis GoldenHilty as directed.  Thank you, Freddie ApleyKelley M. Cleatis PolkaAuten, PharmD  [facility]

## 2016-04-28 NOTE — Patient Instructions (Signed)
Follow-up with Dr. Rennis GoldenHilty as directed  Your blood pressure today is 128/82 (goal <140/90)   Check your blood pressure at home daily (if able) and keep record of the readings.  Take your BP meds as follows:  Bring all of your meds, your BP cuff and your record of home blood pressures to your next appointment.  Exercise as you're able, try to walk approximately 30 minutes per day.  Keep salt intake to a minimum, especially watch canned and prepared boxed foods.  Eat more fresh fruits and vegetables and fewer canned items.  Avoid eating in fast food restaurants.    HOW TO TAKE YOUR BLOOD PRESSURE: . Rest 5 minutes before taking your blood pressure. .  Don't smoke or drink caffeinated beverages for at least 30 minutes before. . Take your blood pressure before (not after) you eat. . Sit comfortably with your back supported and both feet on the floor (don't cross your legs). . Elevate your arm to heart level on a table or a desk. . Use the proper sized cuff. It should fit smoothly and snugly around your bare upper arm. There should be enough room to slip a fingertip under the cuff. The bottom edge of the cuff should be 1 inch above the crease of the elbow. . Ideally, take 3 measurements at one sitting and record the average.

## 2016-12-13 ENCOUNTER — Ambulatory Visit: Payer: Self-pay | Admitting: Family Medicine

## 2017-04-20 ENCOUNTER — Other Ambulatory Visit: Payer: Self-pay | Admitting: Obstetrics

## 2017-04-20 DIAGNOSIS — Z1231 Encounter for screening mammogram for malignant neoplasm of breast: Secondary | ICD-10-CM

## 2017-05-18 ENCOUNTER — Ambulatory Visit
Admission: RE | Admit: 2017-05-18 | Discharge: 2017-05-18 | Disposition: A | Discharge status: Home / Self Care | Payer: 59 | Source: Ambulatory Visit | Attending: Obstetrics | Admitting: Obstetrics

## 2017-05-18 DIAGNOSIS — Z1231 Encounter for screening mammogram for malignant neoplasm of breast: Secondary | ICD-10-CM

## 2017-05-19 ENCOUNTER — Other Ambulatory Visit: Payer: Self-pay | Admitting: Obstetrics

## 2017-05-19 DIAGNOSIS — R928 Other abnormal and inconclusive findings on diagnostic imaging of breast: Secondary | ICD-10-CM

## 2017-05-24 ENCOUNTER — Ambulatory Visit
Admission: RE | Admit: 2017-05-24 | Discharge: 2017-05-24 | Disposition: A | Discharge status: Home / Self Care | Payer: 59 | Source: Ambulatory Visit | Attending: Obstetrics | Admitting: Obstetrics

## 2017-05-24 ENCOUNTER — Other Ambulatory Visit: Payer: Self-pay | Admitting: Obstetrics

## 2017-05-24 DIAGNOSIS — N63 Unspecified lump in unspecified breast: Secondary | ICD-10-CM

## 2017-05-24 DIAGNOSIS — R928 Other abnormal and inconclusive findings on diagnostic imaging of breast: Secondary | ICD-10-CM

## 2017-12-01 ENCOUNTER — Ambulatory Visit
Admission: RE | Admit: 2017-12-01 | Discharge: 2017-12-01 | Disposition: A | Discharge status: Home / Self Care | Payer: 59 | Source: Ambulatory Visit | Attending: Obstetrics | Admitting: Obstetrics

## 2017-12-01 DIAGNOSIS — N63 Unspecified lump in unspecified breast: Secondary | ICD-10-CM

## 2018-02-23 ENCOUNTER — Other Ambulatory Visit: Payer: Self-pay | Admitting: Family Medicine

## 2018-02-23 DIAGNOSIS — E041 Nontoxic single thyroid nodule: Secondary | ICD-10-CM

## 2018-03-06 ENCOUNTER — Ambulatory Visit
Admission: RE | Admit: 2018-03-06 | Discharge: 2018-03-06 | Disposition: A | Discharge status: Home / Self Care | Payer: 59 | Source: Ambulatory Visit | Attending: Family Medicine | Admitting: Family Medicine

## 2018-03-06 DIAGNOSIS — E041 Nontoxic single thyroid nodule: Secondary | ICD-10-CM

## 2018-04-11 ENCOUNTER — Ambulatory Visit
Admission: RE | Admit: 2018-04-11 | Discharge: 2018-04-11 | Disposition: A | Discharge status: Home / Self Care | Payer: Managed Care, Other (non HMO) | Source: Ambulatory Visit | Attending: Family Medicine | Admitting: Family Medicine

## 2018-04-11 ENCOUNTER — Other Ambulatory Visit (HOSPITAL_COMMUNITY)
Admission: RE | Admit: 2018-04-11 | Discharge: 2018-04-11 | Disposition: A | Discharge status: Home / Self Care | Payer: Managed Care, Other (non HMO) | Source: Ambulatory Visit | Attending: Student | Admitting: Student

## 2018-04-11 DIAGNOSIS — E041 Nontoxic single thyroid nodule: Secondary | ICD-10-CM | POA: Insufficient documentation

## 2018-05-18 ENCOUNTER — Encounter: Payer: Managed Care, Other (non HMO) | Admitting: Internal Medicine

## 2018-09-20 ENCOUNTER — Other Ambulatory Visit: Payer: Self-pay | Admitting: Obstetrics

## 2018-09-20 DIAGNOSIS — Z1231 Encounter for screening mammogram for malignant neoplasm of breast: Secondary | ICD-10-CM

## 2018-10-29 ENCOUNTER — Ambulatory Visit
Admission: RE | Admit: 2018-10-29 | Discharge: 2018-10-29 | Disposition: A | Discharge status: Home / Self Care | Payer: Managed Care, Other (non HMO) | Source: Ambulatory Visit | Attending: Obstetrics | Admitting: Obstetrics

## 2018-10-29 DIAGNOSIS — Z1231 Encounter for screening mammogram for malignant neoplasm of breast: Secondary | ICD-10-CM

## 2018-11-07 ENCOUNTER — Ambulatory Visit (INDEPENDENT_AMBULATORY_CARE_PROVIDER_SITE_OTHER): Payer: Managed Care, Other (non HMO) | Admitting: Obstetrics

## 2018-11-07 ENCOUNTER — Encounter: Payer: Self-pay | Admitting: Obstetrics

## 2018-11-07 VITALS — BP 142/87 | HR 84 | Ht 64.0 in | Wt 234.0 lb

## 2018-11-07 DIAGNOSIS — Z124 Encounter for screening for malignant neoplasm of cervix: Secondary | ICD-10-CM | POA: Diagnosis not present

## 2018-11-07 DIAGNOSIS — Z1151 Encounter for screening for human papillomavirus (HPV): Secondary | ICD-10-CM

## 2018-11-07 DIAGNOSIS — N898 Other specified noninflammatory disorders of vagina: Secondary | ICD-10-CM | POA: Diagnosis not present

## 2018-11-07 DIAGNOSIS — Z113 Encounter for screening for infections with a predominantly sexual mode of transmission: Secondary | ICD-10-CM

## 2018-11-07 DIAGNOSIS — Z3009 Encounter for other general counseling and advice on contraception: Secondary | ICD-10-CM

## 2018-11-07 DIAGNOSIS — Z01419 Encounter for gynecological examination (general) (routine) without abnormal findings: Secondary | ICD-10-CM | POA: Diagnosis not present

## 2018-11-07 DIAGNOSIS — Z6841 Body Mass Index (BMI) 40.0 and over, adult: Secondary | ICD-10-CM

## 2018-11-07 NOTE — Progress Notes (Signed)
Patient presents for Annual Exam today.  CC: none   LMP:10/22/18 Last pap:04/07/2016 WNL  Mammogram:10/29/2018 WNL  No family hx of breast cancer.  STD Screening: Full Panel  Contraception:none

## 2018-11-07 NOTE — Progress Notes (Signed)
Subjective:        Tonya Stokes is a 42 y.o. female here for a routine exam.  Current complaints: None.    Personal health questionnaire:  Is patient Tonya Stokes, have a family history of breast and/or ovarian cancer: no Is there a family history of uterine cancer diagnosed at age < 29, gastrointestinal cancer, urinary tract cancer, family member who is a Personnel officer syndrome-associated carrier: no Is the patient overweight and hypertensive, family history of diabetes, personal history of gestational diabetes, preeclampsia or PCOS: no Is patient over 64, have PCOS,  family history of premature CHD under age 48, diabetes, smoke, have hypertension or peripheral artery disease:  no At any time, has a partner hit, kicked or otherwise hurt or frightened you?: no Over the past 2 weeks, have you felt down, depressed or hopeless?: no Over the past 2 weeks, have you felt little interest or pleasure in doing things?:no   Gynecologic History Patient's last menstrual period was 10/22/2018 (exact date). Contraception: none Last Pap: 2017. Results were: normal Last mammogram: 2019. Results were: normal  Obstetric History OB History  No data available    History reviewed. No pertinent past medical history.  Past Surgical History:  Procedure Laterality Date  . CESAREAN SECTION  09/02/2007  . CESAREAN SECTION  02/27/2011     Current Outpatient Medications:  .  chlorthalidone (HYGROTON) 25 MG tablet, Take 0.5 tablets (12.5 mg total) by mouth daily. (Patient not taking: Reported on 11/07/2018), Disp: 15 tablet, Rfl: 3 No Known Allergies  Social History   Tobacco Use  . Smoking status: Never Smoker  . Smokeless tobacco: Never Used  Substance Use Topics  . Alcohol use: No    Family History  Problem Relation Age of Onset  . Irregular heart beat Mother   . Breast cancer Neg Hx       Review of Systems  Constitutional: negative for fatigue and weight loss Respiratory: negative for  cough and wheezing Cardiovascular: negative for chest pain, fatigue and palpitations Gastrointestinal: negative for abdominal pain and change in bowel habits Musculoskeletal:negative for myalgias Neurological: negative for gait problems and tremors Behavioral/Psych: negative for abusive relationship, depression Endocrine: negative for temperature intolerance    Genitourinary:negative for abnormal menstrual periods, genital lesions, hot flashes, sexual problems and vaginal discharge Integument/breast: negative for breast lump, breast tenderness, nipple discharge and skin lesion(s)    Objective:       BP (!) 142/87   Pulse 84   Ht 5\' 4"  (1.626 m)   Wt 234 lb (106.1 kg)   LMP 10/22/2018 (Exact Date)   BMI 40.17 kg/m  General:   alert  Skin:   no rash or abnormalities  Lungs:   clear to auscultation bilaterally  Heart:   regular rate and rhythm, S1, S2 normal, no murmur, click, rub or gallop  Breasts:   normal without suspicious masses, skin or nipple changes or axillary nodes  Abdomen:  normal findings: no organomegaly, soft, non-tender and no hernia  Pelvis:  External genitalia: normal general appearance Urinary system: urethral meatus normal and bladder without fullness, nontender Vaginal: normal without tenderness, induration or masses Cervix: normal appearance Adnexa: normal bimanual exam Uterus: anteverted and non-tender, normal size   Lab Review Urine pregnancy test Labs reviewed yes Radiologic studies reviewed yes  50% of 20 min visit spent on counseling and coordination of care.   Assessment:   1. Encounter for annual routine gynecological examination - doing well  2. Screening for cervical cancer  Rx: - Cytology - PAP( [facility])  3. Vaginal discharge Rx: - Cervicovaginal ancillary only  4. Screening for STD (sexually transmitted disease) Rx: - Hepatitis B surface antigen - Hepatitis C antibody - HIV Antibody (routine testing w rflx) - RPR  5.  Encounter for other general counseling and advice on contraception - declines  6. Class 3 severe obesity due to excess calories without serious comorbidity with body mass index (BMI) of 40.0 to 44.9 in adult [facility](HCC) - program of caloric reduction., exercise and behavioral modification recommended    Plan:    Education reviewed: calcium supplements, depression evaluation, low fat, low cholesterol diet, safe sex/STD prevention, self breast exams and weight bearing exercise. Contraception: none. Follow up in: 2 years.   No orders of the defined types were placed in this encounter.  Orders Placed This Encounter  Procedures  . Hepatitis B surface antigen  . Hepatitis C antibody  . HIV Antibody (routine testing w rflx)  . RPR    Brock BadHARLES A. Cincere Zorn MD 11-07-2018

## 2018-11-08 LAB — RPR: RPR Ser Ql: NONREACTIVE

## 2018-11-08 LAB — CERVICOVAGINAL ANCILLARY ONLY
BACTERIAL VAGINITIS: NEGATIVE
Candida vaginitis: NEGATIVE
Chlamydia: NEGATIVE
NEISSERIA GONORRHEA: NEGATIVE
TRICH (WINDOWPATH): NEGATIVE

## 2018-11-08 LAB — HEPATITIS C ANTIBODY: Hep C Virus Ab: <0.1 s/co ratio (ref 0.0–0.9)

## 2018-11-08 LAB — HEPATITIS B SURFACE ANTIGEN: HEP B S AG: NEGATIVE

## 2018-11-08 LAB — HIV ANTIBODY (ROUTINE TESTING W REFLEX): HIV Screen 4th Generation wRfx: NONREACTIVE

## 2018-11-09 LAB — CYTOLOGY - PAP
Diagnosis: NEGATIVE
HPV (WINDOPATH): NOT DETECTED

## 2019-09-18 ENCOUNTER — Other Ambulatory Visit: Payer: Self-pay

## 2019-09-20 ENCOUNTER — Other Ambulatory Visit: Payer: Self-pay

## 2019-09-20 ENCOUNTER — Ambulatory Visit (INDEPENDENT_AMBULATORY_CARE_PROVIDER_SITE_OTHER): Payer: Managed Care, Other (non HMO) | Admitting: Internal Medicine

## 2019-09-20 ENCOUNTER — Encounter: Payer: Self-pay | Admitting: Internal Medicine

## 2019-09-20 VITALS — BP 140/88 | HR 97 | Ht 64.0 in | Wt 238.0 lb

## 2019-09-20 DIAGNOSIS — E041 Nontoxic single thyroid nodule: Secondary | ICD-10-CM | POA: Insufficient documentation

## 2019-09-20 DIAGNOSIS — E0590 Thyrotoxicosis, unspecified without thyrotoxic crisis or storm: Secondary | ICD-10-CM

## 2019-09-20 LAB — T3, FREE: T3, Free: 5.1 pg/mL — ABNORMAL HIGH (ref 2.3–4.2)

## 2019-09-20 LAB — TSH: TSH: <0.01 u[IU]/mL — ABNORMAL LOW (ref 0.35–4.50)

## 2019-09-20 LAB — T4, FREE: Free T4: 1.14 ng/dL (ref 0.60–1.60)

## 2019-09-20 NOTE — Patient Instructions (Signed)
Please stop at the lab.  Depending on the results, we may need to check a thyroid ultrasound or a thyroid uptake ans scan.  Please return in 3-4 months.   Thyroid Nodule  A thyroid nodule is an isolated growth of thyroid cells that forms a lump in your thyroid gland. The thyroid gland is a butterfly-shaped gland. It is found in the lower front of your neck. This gland sends chemical messengers (hormones) through your blood to all parts of your body. These hormones are important in regulating your body temperature and helping your body to use energy. Thyroid nodules are common. Most are not cancerous (benign). You may have one nodule or several nodules. Different types of thyroid nodules include nodules that:  Grow and fill with fluid (thyroid cysts).  Produce too much thyroid hormone (hot nodules or hyperthyroid).  Produce no thyroid hormone (cold nodules or hypothyroid).  Form from cancer cells (thyroid cancers). What are the causes? In most cases, the cause of this condition is not known. What increases the risk? The following factors may make you more likely to develop this condition.  Age. Thyroid nodules become more common in people who are older than 44 years of age.  Gender. ? Benign thyroid nodules are more common in women. ? Cancerous (malignant) thyroid nodules are more common in men.  A family history that includes: ? Thyroid nodules. ? Pheochromocytoma. ? Thyroid carcinoma. ? Hyperparathyroidism.  Certain kinds of thyroid diseases, such as Hashimoto's thyroiditis.  Lack of iodine in your diet.  A history of head and neck radiation, such as from previous cancer treatment. What are the signs or symptoms? In many cases, there are no symptoms. If you have symptoms, they may include:  A lump in your lower neck.  Feeling a lump or tickle in your throat.  Pain in your neck, jaw, or ear.  Having trouble swallowing. Hot nodules may cause symptoms that include:   Weight loss.  Warm, flushed skin.  Feeling hot.  Feeling nervous.  A racing heartbeat. Cold nodules may cause symptoms that include:  Weight gain.  Dry skin.  Brittle hair. This may also occur with hair loss.  Feeling cold.  Fatigue. Thyroid cancer nodules may cause symptoms that include:  Hard nodules that feel stuck to the thyroid gland.  Hoarseness.  Lumps in the glands near your thyroid (lymph nodes). How is this diagnosed? A thyroid nodule may be felt by your health care provider during a physical exam. This condition may also be diagnosed based on your symptoms. You may also have tests, including:  An ultrasound. This may be done to confirm the diagnosis.  A biopsy. This involves taking a sample from the nodule and looking at it under a microscope.  Blood tests to make sure that your thyroid is working properly.  A thyroid scan. This test uses a radioactive tracer injected into a vein to create an image of the thyroid gland on a computer screen.  Imaging tests such as MRI or CT scan. These may be done if: ? Your nodule is large. ? Your nodule is blocking your airway. ? Cancer is suspected. How is this treated? Treatment depends on the cause and size of your nodule or nodules. If the nodule is benign, treatment may not be necessary. Your health care provider may monitor the nodule to see if it goes away without treatment. If the nodule continues to grow, is cancerous, or does not go away, treatment may be needed. Treatment may  include:  Having a cystic nodule drained with a needle.  Ablation therapy. In this treatment, alcohol is injected into the area of the nodule to destroy the cells. Ablation with heat (thermal ablation) may also be used.  Radioactive iodine. In this treatment, radioactive iodine is given as a pill or liquid that you drink. This substance causes the thyroid nodule to shrink.  Surgery to remove the nodule. Part or all of your thyroid  gland may need to be removed as well.  Medicines. Follow these instructions at home:  Pay attention to any changes in your nodule.  Take over-the-counter and prescription medicines only as told by your health care provider.  Keep all follow-up visits as told by your health care provider. This is important. Contact a health care provider if:  Your voice changes.  You have trouble swallowing.  You have pain in your neck, ear, or jaw that is getting worse.  Your nodule gets bigger.  Your nodule starts to make it harder for you to breathe.  Your muscles look like they are shrinking (muscle wasting). Get help right away if:  You have chest pain.  There is a loss of consciousness.  You have a sudden fever.  You feel confused.  You are seeing or hearing things that other people do not see or hear (having hallucinations).  You feel very weak.  You have mood swings.  You feel very restless.  You feel suddenly nauseous or throw up.  You suddenly have diarrhea. Summary  A thyroid nodule is an isolated growth of thyroid cells that forms a lump in your thyroid gland.  Thyroid nodules are common. Most are not cancerous (benign). You may have one nodule or several nodules.  Treatment depends on the cause and size of your nodule or nodules. If the nodule is benign, treatment may not be necessary.  Your health care provider may monitor the nodule to see if it goes away without treatment. If the nodule continues to grow, is cancerous, or does not go away, treatment may be needed. This information is not intended to replace advice given to you by your health care provider. Make sure you discuss any questions you have with your health care provider. Document Released: 10/14/2004 Document Revised: 07/06/2018 Document Reviewed: 07/09/2018 Elsevier Patient Education  2020 [employer].

## 2019-09-20 NOTE — Progress Notes (Addendum)
Patient ID: Tonya Stokes, female   DOB: [birth year], 43   MRN: [id number]    HPI  Tonya Stokes is a 43 y.o.-year-old female originally from [facility], referred by her PCP, Dr. Luciana Axe, for evaluation for a right thyroid nodule and subclinical thyrotoxicosis.  She saw PCP in 2019 for an APE >> a thyroid nodule was palpated.  She had the first thyroid ultrasound in 02/2018 and it showed a 2.3 cm nodule.  Repeat thyroid ultrasound on 03/13/2018 confirmed the nodule:  Thyroid U/S (02/15/2018): Mildly heterogeneous thyroid, with right inferior 2.3 x 1.3 x 2 cm solid/almost completely solid, isoechoic nodule, with punctate echogenic foci - ACR TI-RADS score: 6 - FNA recommended.  Thyroid U/S (03/13/2018): Parenchymal Echotexture:  Mildly heterogenous Isthmus: 4 mm Right lobe: 6.6 x 2.0 x 2.6 cm Left lobe: 4.5 x 1.9 x 2.3 cm ___________________________________________  Nodule # 1: Location: Right; Inferior Maximum size: 2.4, previously 2.3 cm; Other 2 dimensions: 1.7 x 1.6, previously 1.3 x 2.0 cm Composition: solid/almost completely solid (2) Echogenicity: isoechoic (1) Shape: not taller-than-wide (0) Margins: ill-defined (0) Echogenic foci: punctate echogenic foci (3) ACR TI-RADS total points: 6.  **Given size (>/= 1.5 cm) and appearance, fine needle aspiration of this moderately suspicious nodule should be considered based on TI-RADS criteria. _________________________________________________________  The isthmus and left inferior lobe also demonstrate subcentimeter hypoechoic and isoechoic nodules, all measuring 9 mm or less. These would not meet criteria for any biopsy or follow-up. Normal vascularity. Mild nonspecific thyroid heterogeneity. No adenopathy.  IMPRESSION: 2.4 cm right inferior TR 4 nodule meets criteria for biopsy as above.    FNA of this nodule (04/11/2018): AUS/FLUS, and it appears that the sample was not sent for Afirma  Pt denies: - feeling  nodules in neck - hoarseness - dysphagia - choking - SOB with lying down  I reviewed pt's thyroid tests: 06/06/2019: TSH 0.040, free T4 1.1 (0.6-1.4), total T3 148 (62-194) 02/07/2018: TSH 5.09 (0.45-5.33) Lab Results  Component Value Date   TSH 4.06 03/25/2016    Pt denies: - fatigue - heat intolerance/cold intolerance, + occasional hot flushes, but regular menses - tremors - palpitations - anxiety/depression - hyperdefecation - weight loss/weight gain - hair loss  No FH of thyroid ds. No FH of thyroid cancer. No h/o radiation tx to head or neck.  No seaweed or kelp. No recent contrast studies. No steroid use. No herbal supplements. No Biotin supplements or Hair, Skin and Nails vitamins.  ROS: Constitutional: + See HPI  Eyes: no blurry vision, no xerophthalmia ENT: no sore throat,  + see HPI Cardiovascular: no CP/SOB/palpitations/leg swelling Respiratory: no cough/SOB Gastrointestinal: no N/V/D/C Musculoskeletal: no muscle/joint aches Skin: no rashes Neurological: no tremors/numbness/tingling/dizziness Psychiatric: no depression/anxiety  No past medical history on file. Past Surgical History:  Procedure Laterality Date  . CESAREAN SECTION  09/02/2007  . CESAREAN SECTION  02/27/2011   Social History   Socioeconomic History  . Marital status: Married    Spouse name: Not on file  . Number of children: 59-60 and 14 years old in 09/2019  . Years of education: Not on file  . Highest education level: Not on file  Occupational History  . 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 Financial resource strain: Not on file  . Food insecurity    Worry: Not on file    Inability: Not on file  . Transportation needs    Medical: Not on file    Non-medical: Not  on file  Tobacco Use  . Smoking status: Never Smoker  . Smokeless tobacco: Never Used  Substance and Sexual Activity  . Alcohol use: No  . Drug use: No  Lifestyle  Relationships  Social History  Narrative   epworth sleepiness scale = 5 (03/11/16)   She takes ibuprofen and allergy medications.  No prescription medications  No current outpatient medications on file prior to visit.   No current facility-administered medications on file prior to visit.    No Known Allergies   Family History  Problem Relation Age of Onset  . Irregular heart beat Mother   . Breast cancer Neg Hx   + Heart disease and HL in mother.  PE: BP 140/88   Pulse 97   Ht 5\' 4"  (1.626 m)   Wt 238 lb (108 kg)   LMP 08/25/2019   SpO2 99%   BMI 40.85 kg/m  Wt Readings from Last 3 Encounters:  09/20/19 238 lb (108 kg)  11/07/18 234 lb (106.1 kg)  04/28/16 217 lb 6.4 oz (98.6 kg)   Constitutional: overweight, in NAD Eyes: PERRLA, EOMI, no exophthalmos ENT: moist mucous membranes, + large thyroid nodule palpated in right thyroid lobe and isthmus, no cervical lymphadenopathy Cardiovascular: tachycardia, RR, No MRG, + bilateral pitting edema around ankles Respiratory: CTA B Gastrointestinal: abdomen soft, NT, ND, BS+ Musculoskeletal: no deformities, strength intact in all 4;  Skin: moist, warm, no rashes Neurological: no tremor with outstretched hands, DTR normal in all 4  ASSESSMENT: 1. Thyroid nodule -in inferior right lobe  2.  Subclinical thyrotoxicosis  PLAN: 1. Thyroid nodule - I reviewed the images of her thyroid ultrasound along with the patient.  She has a large dominant nodule in the right inferior lobe, measuring 2.4 x 1.7 x 1.6, approximately stable from previous ultrasound in which it measured 2.3 x 1.3 x 2.0 cm, but this was done only a month prior.  The nodule contains few microcalcifications, this being a risk factor for cancer.  It was biopsied in 04/2018 and the results were inconclusive: AUS/FLUS.  The results do not appear to have been sent for Afirma molecular marker. -The nodule otherwise appears isoechoic and does not have internal blood flow, which would further increase the  risk for malignancy.  It is not more white than tall and does not appear to invaginating the surrounding tissue.Pt does not have a thyroid cancer family history or a personal history of RxTx to head/neck. -We reviewed together the images and I reviewed with her the possible indications of malignancy.  However, I explained that the vast majority of the nodules are benign. -We discussed about repeating a thyroid ultrasound now and to repeat the nodule biopsy this time with Afirma molecular marker in case the biopsy is inconclusive - We discussed about the possible diagnosis of cancer, and further planning for this - I advised pt to join my chart and I will send her the results through there   2.  Subclinical thyrotoxicosis -Patient had normal thyroid function tests in 2017 and 2019, however, in 06/2019 TSH returned suppressed, at 0.04.  Free T4 and total T3 were normal -She denies thyrotoxic sxs: weight loss, heat intolerance, hyperdefecation, palpitations, anxiety.  However, she wass tachycardic on exam today.  Towards the end of the visit, pulse was proximately 90 - she does not appear to have exogenous causes for the low TSH.  - We discussed that possible causes of thyrotoxicosis are:  07/2019 Graves ds   . Thyroiditis .  toxic multinodular goiter/ toxic adenoma  -it is possible that her right nodule (or some of the smaller nodules) = thyrotoxic - will check the TSH, fT3 and fT4 and also add thyroid stimulating antibodies to screen for Graves' disease.  - If the tests remain abnormal, we may need an uptake and scan to differentiate between the 3 above possible etiologies  - we discussed about possible modalities of treatment for the above conditions, to include methimazole use, radioactive iodine ablation or (last resort) surgery. - if the right thyroid nodule appears to be thyrotoxic, these nodules are extremely rarely malignant, so I believe we can go ahead with RAI treatment at that time, without  necessarily doing a biopsy. - I advised her to join my chart to communicate easier - RTC in 3-4 months, but likely sooner for repeat labs  Component     Latest Ref Rng & Units 09/20/2019  TSH     0.35 - 4.50 uIU/mL <0.01 (L)  T4,Free(Direct)     0.60 - 1.60 ng/dL 9.62  Triiodothyronine,Free,Serum     2.3 - 4.2 pg/mL 5.1 (H)  TSI     <140 % baseline 205 (H)   TFTs are suppressed and TSI's are mildly elevated, which could mean Graves' disease, but does not necessarily exclude a "hot" thyroid nodule.  I would like to obtain a thyroid uptake and scan but will also suggest that she started methimazole 5 mg daily.  I will have her hold methimazole for 5 days before the uptake and scan.  We will then need to repeat TFTs in 1.5 months.  Addendum: NM THYROID MULT UPTAKE W/IMAGING CLINICAL DATA:  Thyrotoxicosis without thyroid storm, RIGHT thyroid nodule on ultrasound, suppressed TSH  EXAM: THYROID SCAN AND UPTAKE - 4 AND 24 HOURS  TECHNIQUE: Following oral administration of I-123 capsule, anterior planar imaging was acquired at 24 hours. Thyroid uptake was calculated with a thyroid probe at 4-6 hours and 24 hours.  RADIOPHARMACEUTICALS:  405 uCi I-123 sodium iodide p.o.  COMPARISON:  None  FINDINGS: Cold nodule at inferior pole of RIGHT thyroid lobe, corresponding to nodule seen on ultrasound.  Uptake of tracer within remaining thyroid tissue is homogeneous.  No additional areas of increased or decreased tracer localization seen.  4 hour I-123 uptake = 19.4% (normal 5-20%)  24 hour I-123 uptake = 41.3% (normal 10-30%)  IMPRESSION: Cold nodule at inferior pole of RIGHT thyroid lobe corresponding to nodule identified by prior ultrasound and previously biopsied in 2019, recommend correlation with pathology.  Elevated 24 hour radio iodine uptake consistent with hyperthyroidism.  Electronically Signed   By: Ulyses Southward M.D.   On: 10/16/2019 11:48  Cold right nodule.  We  will need to rebiopsy this, since the first biopsy was inconclusive. For now I would suggest to continue methimazole but RAI treatment is another option.  We will discuss with the patient.  Thyroid U/S (11/18/2019): Parenchymal Echotexture: Moderately heterogenous Isthmus: 0.4 cm Right lobe: 6.1 x 2.5 x 2.2 cm Left lobe: 5.4 x 1.9 x 2.3 cm ____________________________________________________  Nodule # 2: The previously biopsied nodule in the right inferior gland is essentially unchanged at 2.3 x 1.7 x 1.6 cm compared to 2.4 x 1.7 x 1.6 cm previously.  Nodule # 1: A small isoechoic solid nodule in the thyroid isthmus measures up to 1.3 cm. This nodule a does not meet criteria for biopsy or imaging surveillance.  IMPRESSION: No significant interval change in the size or appearance of the previously biopsied  2.3 cm nodule in the right inferior thyroid gland.  We will continue with the plan to rebiopsy the 2.3 cm right inferior nodule.  Carlus Pavlovristina Mellissa Conley, MD PhD [facility]

## 2019-09-24 ENCOUNTER — Other Ambulatory Visit: Payer: Self-pay | Admitting: Internal Medicine

## 2019-09-24 DIAGNOSIS — E041 Nontoxic single thyroid nodule: Secondary | ICD-10-CM

## 2019-09-24 DIAGNOSIS — E0590 Thyrotoxicosis, unspecified without thyrotoxic crisis or storm: Secondary | ICD-10-CM

## 2019-09-24 LAB — THYROID STIMULATING IMMUNOGLOBULIN: TSI: 205 % baseline — ABNORMAL HIGH (ref <140)

## 2019-09-24 MED ORDER — METHIMAZOLE 5 MG PO TABS: 5.0000 mg | ORAL_TABLET | Freq: Every day | ORAL | 3 refills | Status: DC

## 2019-09-27 ENCOUNTER — Other Ambulatory Visit: Payer: Self-pay | Admitting: Family Medicine

## 2019-09-27 DIAGNOSIS — Z1231 Encounter for screening mammogram for malignant neoplasm of breast: Secondary | ICD-10-CM

## 2019-10-15 ENCOUNTER — Encounter (HOSPITAL_COMMUNITY)
Admission: RE | Admit: 2019-10-15 | Discharge: 2019-10-15 | Disposition: A | Discharge status: Home / Self Care | Payer: Managed Care, Other (non HMO) | Source: Ambulatory Visit | Attending: Internal Medicine | Admitting: Internal Medicine

## 2019-10-15 ENCOUNTER — Other Ambulatory Visit: Payer: Self-pay

## 2019-10-15 DIAGNOSIS — E041 Nontoxic single thyroid nodule: Secondary | ICD-10-CM | POA: Diagnosis not present

## 2019-10-15 DIAGNOSIS — E0590 Thyrotoxicosis, unspecified without thyrotoxic crisis or storm: Secondary | ICD-10-CM | POA: Diagnosis present

## 2019-10-15 MED ORDER — SODIUM IODIDE I-123 7.4 MBQ CAPS
405.0000 | ORAL_CAPSULE | Freq: Once | ORAL | Status: AC
  Administered 2019-10-15: 11:00:00 405 via ORAL

## 2019-10-16 ENCOUNTER — Encounter (HOSPITAL_COMMUNITY)
Admission: RE | Admit: 2019-10-16 | Discharge: 2019-10-16 | Disposition: A | Discharge status: Home / Self Care | Payer: Managed Care, Other (non HMO) | Source: Ambulatory Visit | Attending: Internal Medicine | Admitting: Internal Medicine

## 2019-11-04 ENCOUNTER — Telehealth: Payer: Self-pay

## 2019-11-04 ENCOUNTER — Other Ambulatory Visit: Payer: Self-pay | Admitting: Internal Medicine

## 2019-11-04 DIAGNOSIS — E041 Nontoxic single thyroid nodule: Secondary | ICD-10-CM

## 2019-11-04 NOTE — Telephone Encounter (Signed)
Patient does not use her MyChart, the results she was asking about was the scan.  I read her your result note and she agrees to have the Bx.

## 2019-11-04 NOTE — Telephone Encounter (Signed)
I ordered it. Can you please inactivate her my chart if she is not using it, so that we do not use this to communicate with her?

## 2019-11-04 NOTE — Telephone Encounter (Signed)
Patent called in wanting her test results      Please call and advise

## 2019-11-04 NOTE — Telephone Encounter (Signed)
Did she not read my msg???  Written by Philemon Kingdom, MD on 09/24/2019 5:05 PM  Dear Ms. Broadfoot,  The tests are now back and the TSH is still quite low, undetectable. The free thyroid hormones are either normal or only slightly elevated. The Graves' antibodies are slightly high, but not enough to clearly suggest Graves' disease.  At this point, as we discussed, I would suggest to go ahead with a thyroid uptake and scan. I ordered the test to be done at [facility]. If you are not called to schedule this within a week, please call [phone].  For now, since the tests are abnormal, I would suggest to start a low-dose methimazole, 5 mg daily and recheck your thyroid test in 1.5 months.  Please stop the methimazole and call us if you develop:  - sore throat  - fever  - yellow skin  - dark urine  - light colored stools  As we will then need to check your blood counts and liver tests in that case.  I sent this prescription to your pharmacy.  Please call our main office number [phone]) to schedule a lab appointment.  It is important to stop the methimazole approximately 5 days before your thyroid uptake and scan and then resume it immediately after you have the scan.  Please let me know if you have any questions.  Sincerely,  Philemon Kingdom MD

## 2019-11-05 ENCOUNTER — Telehealth: Payer: Self-pay | Admitting: Internal Medicine

## 2019-11-05 ENCOUNTER — Other Ambulatory Visit: Payer: Self-pay | Admitting: Internal Medicine

## 2019-11-05 DIAGNOSIS — E041 Nontoxic single thyroid nodule: Secondary | ICD-10-CM

## 2019-11-05 NOTE — Telephone Encounter (Signed)
Pt's mychart account has been deactivated.

## 2019-11-05 NOTE — Telephone Encounter (Signed)
Dr. Cruzita Lederer, can you please place this new order at your earliest convenience?

## 2019-11-05 NOTE — Telephone Encounter (Signed)
I ordered the U/S -can you please let the patient know?

## 2019-11-05 NOTE — Telephone Encounter (Signed)
Tonya Stokes with [facility] requests to be called at ph# [phone] re: Thyroid Biopsy Order-Patient will need to have a new Ultrasound before the biopsy can be done. Please call Tonya Stokes to let her know that this message has been received and that the new Order has been placed.

## 2019-11-06 NOTE — Telephone Encounter (Signed)
Called brittany with [facility] and informed her of this.

## 2019-11-18 ENCOUNTER — Ambulatory Visit
Admission: RE | Admit: 2019-11-18 | Discharge: 2019-11-18 | Disposition: A | Discharge status: Home / Self Care | Payer: Managed Care, Other (non HMO) | Source: Ambulatory Visit | Attending: Internal Medicine | Admitting: Internal Medicine

## 2019-11-18 DIAGNOSIS — E041 Nontoxic single thyroid nodule: Secondary | ICD-10-CM

## 2019-11-19 ENCOUNTER — Telehealth: Payer: Self-pay

## 2019-11-19 ENCOUNTER — Other Ambulatory Visit: Payer: Self-pay | Admitting: Family Medicine

## 2019-11-19 NOTE — Addendum Note (Signed)
Addended by: Philemon Kingdom on: 11/19/2019 07:26 AM   Modules accepted: Orders

## 2019-11-19 NOTE — Telephone Encounter (Signed)
-----   Message from Philemon Kingdom, MD sent at 11/19/2019  7:27 AM EST ----- Lenna Sciara, can you please call pt:  The thyroid ultrasound results are back and they showed that the right thyroid nodule appears stable.  I would suggest to continue with the plan to obtain the biopsy for this nodule, since she has previously inconclusive biopsy.  I did order this to be done downstairs in [place] [facility]. She also has a small, benign-appearing nodule in the isthmus between the 2 thyroid lobes, but no intervention is needed for this. Ty, C

## 2019-11-19 NOTE — Telephone Encounter (Signed)
Notified patient of message from Dr. Cruzita Lederer, patient expressed understanding and agreement. No further questions.  Patient was contacted but they are filled for this month and they will call her in Jan.

## 2019-11-20 ENCOUNTER — Ambulatory Visit
Admission: RE | Admit: 2019-11-20 | Discharge: 2019-11-20 | Disposition: A | Discharge status: Home / Self Care | Payer: Managed Care, Other (non HMO) | Source: Ambulatory Visit | Attending: Family Medicine | Admitting: Family Medicine

## 2019-11-20 ENCOUNTER — Other Ambulatory Visit: Payer: Self-pay

## 2019-11-20 DIAGNOSIS — Z1231 Encounter for screening mammogram for malignant neoplasm of breast: Secondary | ICD-10-CM

## 2020-01-20 ENCOUNTER — Other Ambulatory Visit: Payer: Self-pay

## 2020-01-21 DIAGNOSIS — E0500 Thyrotoxicosis with diffuse goiter without thyrotoxic crisis or storm: Secondary | ICD-10-CM | POA: Insufficient documentation

## 2020-01-21 NOTE — Progress Notes (Addendum)
Patient ID: Tonya Stokes, female   DOB: December 07, 1975, 44 y.o.   MRN: [id number]   This visit occurred during the SARS-CoV-2 public health emergency.  Safety protocols were in place, including screening questions prior to the visit, additional usage of staff PPE, and extensive cleaning of exam room while observing appropriate contact time as indicated for disinfecting solutions.   HPI  Tonya Stokes is a 44 y.o.-year-old female, originally from [facility], initially referred by her PCP, Dr. Luciana Axe, returning for follow-up for a right thyroid nodule and subclinical thyrotoxicosis/Graves' disease.  Last visit 4 months ago.  Patient describes that she saw her PCP 2019 for an annual physical exam and a right thyroid nodule was palpated.  She had a thyroid ultrasound in 2019 and this showed a 2.3 cm nodule.  A repeat ultrasound a month later confirmed the nodule:  Thyroid U/S (02/15/2018): Mildly heterogeneous thyroid, with right inferior 2.3 x 1.3 x 2 cm solid/almost completely solid, isoechoic nodule, with punctate echogenic foci - ACR TI-RADS score: 6 - FNA recommended.  Thyroid U/S (03/13/2018): Parenchymal Echotexture:  Mildly heterogenous Isthmus: 4 mm Right lobe: 6.6 x 2.0 x 2.6 cm Left lobe: 4.5 x 1.9 x 2.3 cm ___________________________________________  Nodule # 1: Location: Right; Inferior Maximum size: 2.4, previously 2.3 cm; Other 2 dimensions: 1.7 x 1.6, previously 1.3 x 2.0 cm Composition: solid/almost completely solid (2) Echogenicity: isoechoic (1) Shape: not taller-than-wide (0) Margins: ill-defined (0) Echogenic foci: punctate echogenic foci (3) ACR TI-RADS total points: 6.  **Given size (>/= 1.5 cm) and appearance, fine needle aspiration of this moderately suspicious nodule should be considered based on TI-RADS criteria. _________________________________________________________  The isthmus and left inferior lobe also demonstrate subcentimeter hypoechoic and  isoechoic nodules, all measuring 9 mm or less. These would not meet criteria for any biopsy or follow-up. Normal vascularity. Mild nonspecific thyroid heterogeneity. No adenopathy.  IMPRESSION: 2.4 cm right inferior TR 4 nodule meets criteria for biopsy as above.    FNA of this nodule (04/11/2018): AUS/FLUS, but the sample was not sent for Afirma molecular marker  Thyroid uptake and scan (10/16/2019):  Cold nodule at inferior pole of RIGHT thyroid lobe, corresponding to nodule seen on ultrasound. Uptake of tracer within remaining thyroid tissue is homogeneous. No additional areas of increased or decreased tracer localization seen.  4 hour I-123 uptake = 19.4% (normal 5-20%) 24 hour I-123 uptake = 41.3% (normal 10-30%)  IMPRESSION: Cold nodule at inferior pole of RIGHT thyroid lobe corresponding to nodule identified by prior ultrasound and previously biopsied in 2019, recommend correlation with pathology.  Elevated 24 hour radio iodine uptake consistent with hyperthyroidism.  Thyroid U/S (11/18/2019): Parenchymal Echotexture: Moderately heterogenous Isthmus: 0.4 cm Right lobe: 6.1 x 2.5 x 2.2 cm Left lobe: 5.4 x 1.9 x 2.3 cm ____________________________________________________  Nodule # 2: The previously biopsied nodule in the right inferior gland is essentially unchanged at 2.3 x 1.7 x 1.6 cm compared to 2.4 x 1.7 x 1.6 cm previously.  Nodule # 1: A small isoechoic solid nodule in the thyroid isthmus measures up to 1.3 cm. This nodule a does not meet criteria for biopsy or imaging surveillance.  IMPRESSION: No significant interval change in the size or appearance of the previously biopsied 2.3 cm nodule in the right inferior thyroid gland.  I ordered a biopsy of the dominant nodules, but patient did not have this yet...  Pt denies: - feeling nodules in neck - hoarseness - dysphagia - choking - SOB with lying down  Last year she was also found to be  biochemically thyrotoxic, most likely related to Graves' disease.  I reviewed patient's TFTs: Lab Results  Component Value Date   TSH <0.01 (L) 09/20/2019   TSH 4.06 03/25/2016   FREET4 1.14 09/20/2019  06/06/2019: TSH 0.040, free T4 1.1 (0.6-1.4), total T3 148 (62-194) 02/07/2018: TSH 5.09 (0.45-5.33)  Graves' antibodies were elevated: Lab Results  Component Value Date   TSI 205 (H) 09/20/2019   We started methimazole 5 mg daily in 09/2019.  She did not return for follow-up labs afterwards. At this visit, she tells me she actually stopped the methimazole before the uptake and scan and did not restart it afterwards as she did not see my My chart message...  No FH of thyroid ds. No FH of thyroid cancer. No h/o radiation tx to head or neck.  No seaweed or kelp. No recent contrast studies. No herbal supplements. No Biotin use. No recent steroids use.   ROS: Constitutional: no weight gain/no weight loss, no fatigue, no subjective hyperthermia, no subjective hypothermia Eyes: no blurry vision, no xerophthalmia ENT: no sore throat, + see HPI Cardiovascular: no CP/no SOB/+ palpitations/no leg swelling Respiratory: no cough/no SOB/no wheezing Gastrointestinal: no N/no V/no D/no C/no acid reflux Musculoskeletal: no muscle aches/no joint aches Skin: no rashes, no hair loss Neurological: no tremors/no numbness/no tingling/no dizziness  I reviewed pt's medications, allergies, PMH, social hx, family hx, and changes were documented in the history of present illness. Otherwise, unchanged from my initial visit note.  No past medical history on file. Past Surgical History:  Procedure Laterality Date  . CESAREAN SECTION  09/02/2007  . CESAREAN SECTION  02/27/2011   Social History   Socioeconomic History  . Marital status: Married    Spouse name: Not on file  . Number of children: 4-22 and 23 years old in 09/2019  . Years of education: Not on file  . Highest education level: Not on file   Occupational History  . 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 Financial resource strain: Not on file  . Food insecurity    Worry: Not on file    Inability: Not on file  . Transportation needs    Medical: Not on file    Non-medical: Not on file  Tobacco Use  . Smoking status: Never Smoker  . Smokeless tobacco: Never Used  Substance and Sexual Activity  . Alcohol use: No  . Drug use: No  Lifestyle  Relationships  Social History Narrative   epworth sleepiness scale = 5 (03/11/16)   She takes ibuprofen and allergy medications.  No prescription medications  Current Outpatient Medications on File Prior to Visit  Medication Sig Dispense Refill  . methimazole (TAPAZOLE) 5 MG tablet Take 1 tablet (5 mg total) by mouth daily. With a meal. 45 tablet 3   No current facility-administered medications on file prior to visit.   No Known Allergies   Family History  Problem Relation Age of Onset  . Irregular heart beat Mother   . Breast cancer Neg Hx   + Heart disease and HL in mother.  PE: BP 130/80   Pulse (!) 115   Ht 5\' 4"  (1.626 m)   Wt 238 lb (108 kg)   SpO2 97%   BMI 40.85 kg/m  Wt Readings from Last 3 Encounters:  01/22/20 238 lb (108 kg)  09/20/19 238 lb (108 kg)  11/07/18 234 lb (106.1 kg)   Constitutional: overweight, in  NAD Eyes: PERRLA, EOMI, no exophthalmos ENT: moist mucous membranes, no thyromegaly, but + large thyroid nodule palpated in the right thyroid lobe and isthmus, no cervical lymphadenopathy Cardiovascular: tachycardia, RR, No MRG, + bilateral periankle edema Respiratory: CTA B Gastrointestinal: abdomen soft, NT, ND, BS+ Musculoskeletal: no deformities, strength intact in all 4 Skin: moist, warm, no rashes Neurological: no tremor with outstretched hands, DTR normal in all 4  ASSESSMENT: 1. Thyroid nodule -in inferior right lobe  2.  Subclinical thyrotoxicosis  PLAN: 1. Thyroid nodule -I reviewed the images and report of her  thyroid ultrasound along with the patient.  She has a large dominant nodule which was biopsied in 04/2018 with inconclusive result.  The results were unfortunately not sent for the Afirma molecular marker.  At last visit we checked another thyroid ultrasound and the nodule appears stable.  An uptake and scan showed that this nodule was actually cold.  We discussed that this increases the possible risk of cancer.  However, the nodule appears isoechoic and does not have internal blood flow.  Also, it is not more wide than tall and does not appear to be invaginating in the surrounding tissue.  These are good signs.  She also does not have a family history of thyroid cancer and personal history of radiation therapy to head or neck.  At last visit, I ordered a new thyroid biopsy but she did not have this done yet. -We will go ahead and reorder the thyroid biopsy -I again advised her to join my chart for easier communication  2.  Graves disease -Manifested as subclinical thyrotoxicosis -Patient had normal thyroid function test in 2017 and 2019 however, in 06/2019, TSH was suppressed.  This was confirmed at our last visit in 09/2019.  Free T4 and free T3 are normal -Her TSI antibodies were also elevated -Thyroid scan showed homogeneous scan except for a cold nodule and also elevated uptake -We started methimazole 5 mg daily at last visit.  She did not return for follow-up labs and she tells me that she actually was on it for just few days before her thyroid uptake and scan and that did not restarted.  She did not seem to my chart message mentioning that she needs to restart it right away after the uptake and scan.  I strongly advised her to come in again with the MyChart technical help team to make sure she get log on -She continues to deny thyrotoxic symptoms: Weight loss, heat intolerance, hyper defecation, anxiety, but at this visit, she does mention palpitations.  Her pulse is also high, 115 today, so we will  start atenolol once or twice a day, as needed -At this visit we will recheck her TFTs and most likely will need to restart medications -We discussed about Graves' disease being autoimmune disease in which her immune system over stimulates her thyroid gland. -Discussed about possible treatment for Graves' disease to include methimazole, radioactive iodine ablation or, last resort, surgery.  For now we will retry methimazole, but she would be open to RAI treatment if needed. -I will see her back in 4 months  Component     Latest Ref Rng & Units 01/22/2020  TSH     0.35 - 4.50 uIU/mL <0.01 (L)  T4,Free(Direct)     0.60 - 1.60 ng/dL 5.68  Triiodothyronine,Free,Serum     2.3 - 4.2 pg/mL 5.5 (H)  TFTs are still abnormal.  Will start the low-dose methimazole, 5 mg daily and repeat her test in  1.5 months.  FNA (01/30/2020):  Clinical History: Nodule #2: The previously biopsied nodule in the right  inferio gland is essentially unchanged at 2.3 x 1.7 x 1.6cm compared to  2.4 x 1.7 x 1.6cm previously, 04/21/18 [facility]  Specimen Submitted: A. THYROID, RLP, FINE NEEDLE ASPIRATION:    FINAL MICROSCOPIC DIAGNOSIS:  - Follicular lesion of undetermined significance ([facility] category III)    SPECIMEN ADEQUACY:  Satisfactory for evaluation   DIAGNOSTIC COMMENTS:  This specimen will be sent for Afirma testing.   AFIRMA: Benign (risk of malignancy approximately 4%).  Carlus Pavlov, MD PhD [facility] Endocrinology

## 2020-01-22 ENCOUNTER — Ambulatory Visit: Payer: Managed Care, Other (non HMO) | Admitting: Internal Medicine

## 2020-01-22 ENCOUNTER — Telehealth: Payer: Self-pay

## 2020-01-22 ENCOUNTER — Encounter: Payer: Self-pay | Admitting: Internal Medicine

## 2020-01-22 ENCOUNTER — Other Ambulatory Visit: Payer: Self-pay

## 2020-01-22 VITALS — BP 130/80 | HR 115 | Ht 64.0 in | Wt 238.0 lb

## 2020-01-22 DIAGNOSIS — E041 Nontoxic single thyroid nodule: Secondary | ICD-10-CM | POA: Diagnosis not present

## 2020-01-22 DIAGNOSIS — E0500 Thyrotoxicosis with diffuse goiter without thyrotoxic crisis or storm: Secondary | ICD-10-CM | POA: Diagnosis not present

## 2020-01-22 LAB — T3, FREE: T3, Free: 5.5 pg/mL — ABNORMAL HIGH (ref 2.3–4.2)

## 2020-01-22 LAB — T4, FREE: Free T4: 1.18 ng/dL (ref 0.60–1.60)

## 2020-01-22 LAB — TSH: TSH: <0.01 u[IU]/mL — ABNORMAL LOW (ref 0.35–4.50)

## 2020-01-22 MED ORDER — ATENOLOL 25 MG PO TABS: 25.0000 mg | ORAL_TABLET | Freq: Two times a day (BID) | ORAL | 3 refills | Status: DC

## 2020-01-22 NOTE — Patient Instructions (Addendum)
Please stop at the lab.  You will be called to schedule the thyroid biopsy.  Please start Atenolol 25 mg 1x dily and can increase to 2x daily if your pulse remains high, >90 at rest.  Please come back for a follow-up appointment in 4 months.

## 2020-01-22 NOTE — Telephone Encounter (Signed)
Notified patient of message from Dr. Gherghe, patient expressed understanding and agreement. No further questions.  

## 2020-01-22 NOTE — Telephone Encounter (Signed)
-----   Message from Carlus Pavlov, MD sent at 01/22/2020  1:25 PM EST ----- Efraim Kaufmann, can you please call pt: Tonya Stokes TFTs are still abnormal.  Please start the low-dose methimazole, 5 mg daily and repeat Tonya Stokes tests in 1.5 months.  Labs are in.

## 2020-01-30 ENCOUNTER — Ambulatory Visit
Admission: RE | Admit: 2020-01-30 | Discharge: 2020-01-30 | Disposition: A | Discharge status: Home / Self Care | Payer: Managed Care, Other (non HMO) | Source: Ambulatory Visit | Attending: Internal Medicine | Admitting: Internal Medicine

## 2020-01-30 ENCOUNTER — Other Ambulatory Visit (HOSPITAL_COMMUNITY)
Admission: RE | Admit: 2020-01-30 | Discharge: 2020-01-30 | Disposition: A | Discharge status: Home / Self Care | Payer: Managed Care, Other (non HMO) | Source: Ambulatory Visit | Attending: Radiology | Admitting: Radiology

## 2020-01-30 DIAGNOSIS — E041 Nontoxic single thyroid nodule: Secondary | ICD-10-CM | POA: Diagnosis present

## 2020-02-03 LAB — CYTOLOGY - NON PAP

## 2020-02-05 ENCOUNTER — Telehealth: Payer: Self-pay

## 2020-02-05 NOTE — Telephone Encounter (Signed)
-----   Message from Carlus Pavlov, MD sent at 02/03/2020  5:26 PM EST ----- Efraim Kaufmann, can you please call pt: The thyroid nodule biopsy has returned and unfortunately this is inconclusive.  A sample of it was sent for further analysis (molecular testing).  This usually takes approximately 2-3 weeks.  If she does not hear from [place] do not hear from [place] in this interval, please ask her to call us so that we can check on this.

## 2020-02-05 NOTE — Telephone Encounter (Signed)
Notified patient of message from Dr. Gherghe, patient expressed understanding and agreement. No further questions.  

## 2020-02-18 ENCOUNTER — Telehealth: Payer: Self-pay

## 2020-02-18 NOTE — Telephone Encounter (Signed)
-----   Message from Carlus Pavlov, MD sent at 02/17/2020  5:09 PM EDT ----- Tonya Stokes, can you please call Tonya Stokes:  Good news: The Afirma molecular marker results are back and they are benign.  This gives her a very low risk for her thyroid nodule so for now, we only need to follow her without intervention.

## 2020-02-19 NOTE — Telephone Encounter (Signed)
Notified patient of message from Dr. Gherghe, patient expressed understanding and agreement. No further questions.  

## 2020-02-28 ENCOUNTER — Encounter (HOSPITAL_COMMUNITY): Payer: Self-pay

## 2020-04-28 ENCOUNTER — Ambulatory Visit (INDEPENDENT_AMBULATORY_CARE_PROVIDER_SITE_OTHER): Payer: Managed Care, Other (non HMO) | Admitting: Obstetrics

## 2020-04-28 ENCOUNTER — Other Ambulatory Visit (HOSPITAL_COMMUNITY)
Admission: RE | Admit: 2020-04-28 | Discharge: 2020-04-28 | Disposition: A | Discharge status: Home / Self Care | Payer: Managed Care, Other (non HMO) | Source: Ambulatory Visit | Attending: Obstetrics | Admitting: Obstetrics

## 2020-04-28 ENCOUNTER — Other Ambulatory Visit: Payer: Self-pay

## 2020-04-28 ENCOUNTER — Encounter: Payer: Self-pay | Admitting: Obstetrics

## 2020-04-28 VITALS — BP 132/85 | HR 68 | Ht 65.0 in | Wt 243.0 lb

## 2020-04-28 DIAGNOSIS — N898 Other specified noninflammatory disorders of vagina: Secondary | ICD-10-CM | POA: Insufficient documentation

## 2020-04-28 DIAGNOSIS — Z01419 Encounter for gynecological examination (general) (routine) without abnormal findings: Secondary | ICD-10-CM | POA: Diagnosis present

## 2020-04-28 DIAGNOSIS — Z6841 Body Mass Index (BMI) 40.0 and over, adult: Secondary | ICD-10-CM

## 2020-04-28 DIAGNOSIS — Z113 Encounter for screening for infections with a predominantly sexual mode of transmission: Secondary | ICD-10-CM | POA: Diagnosis not present

## 2020-04-28 DIAGNOSIS — Z1272 Encounter for screening for malignant neoplasm of vagina: Secondary | ICD-10-CM

## 2020-04-28 DIAGNOSIS — E0500 Thyrotoxicosis with diffuse goiter without thyrotoxic crisis or storm: Secondary | ICD-10-CM

## 2020-04-28 IMAGING — US US THYROID
1 series · 13 of 25 positions shown · non-contrast
Comparison: Most recent prior thyroid ultrasound 03/06/2018

CLINICAL DATA: Goiter. 43-year-old female with a history of
multiple thyroid nodules. She previously underwent fine-needle
aspiration biopsy of the right inferior nodule on 04/11/2018.

EXAM:
THYROID ULTRASOUND
TECHNIQUE: Ultrasound examination of the thyroid gland and adjacent soft
tissues was performed.

[Series 1: us thyroid · 0.07mm/px · 13 of 44 slices shown]
[im 1/44]
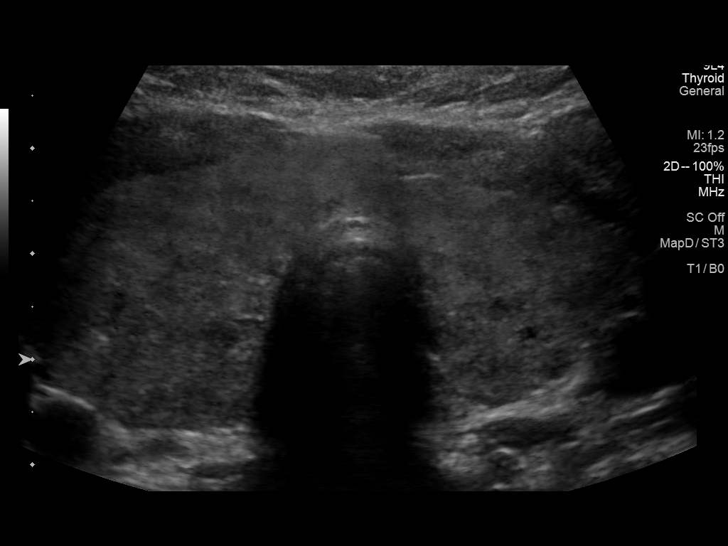
[im 4/44]
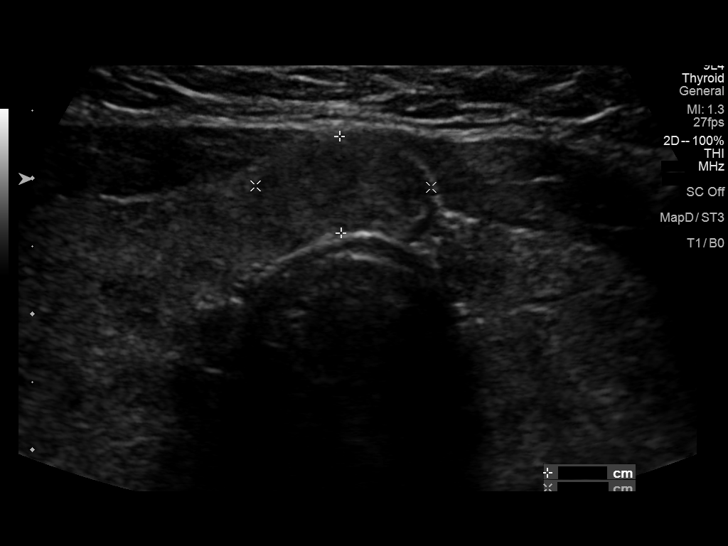
[im 8/44]
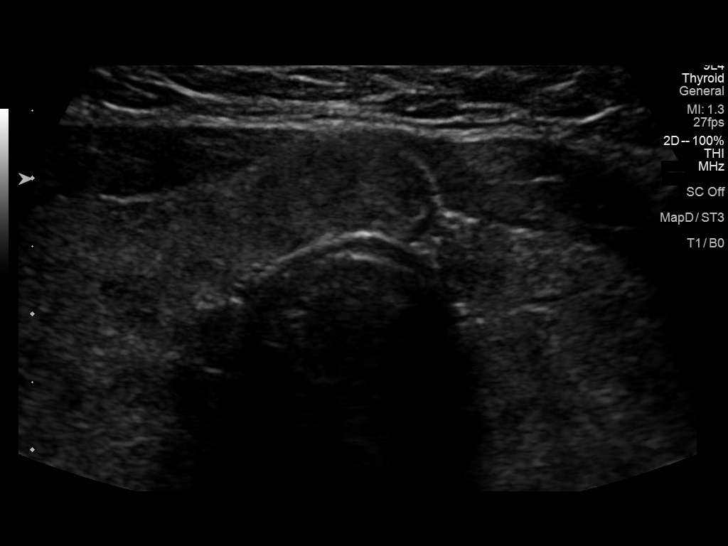
[im 11/44]
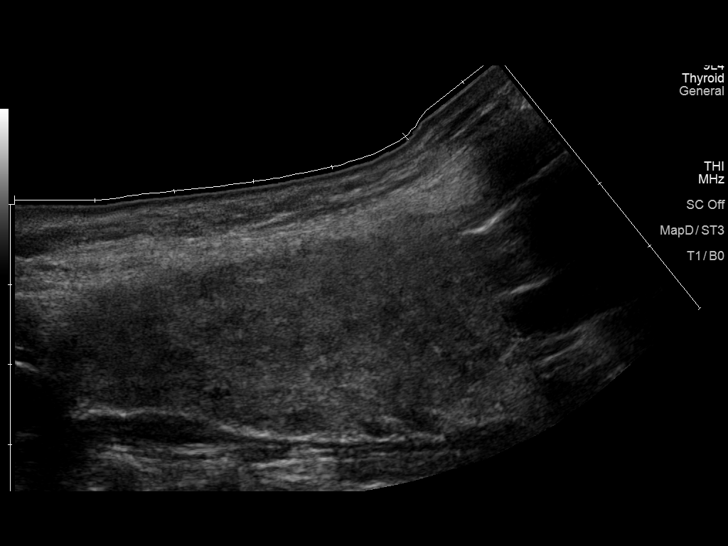
[im 15/44]
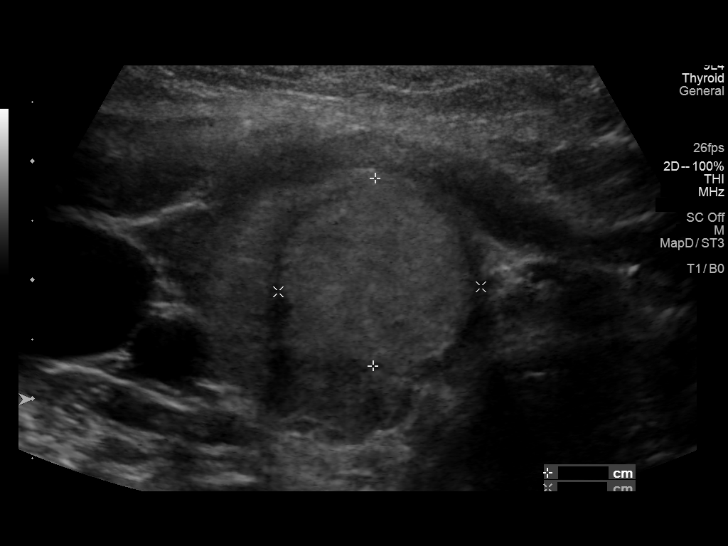
[im 18/44]
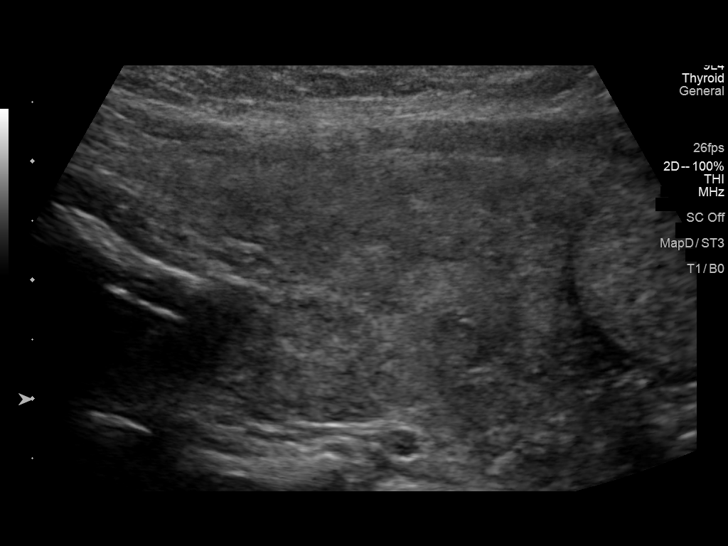
[im 22/44]
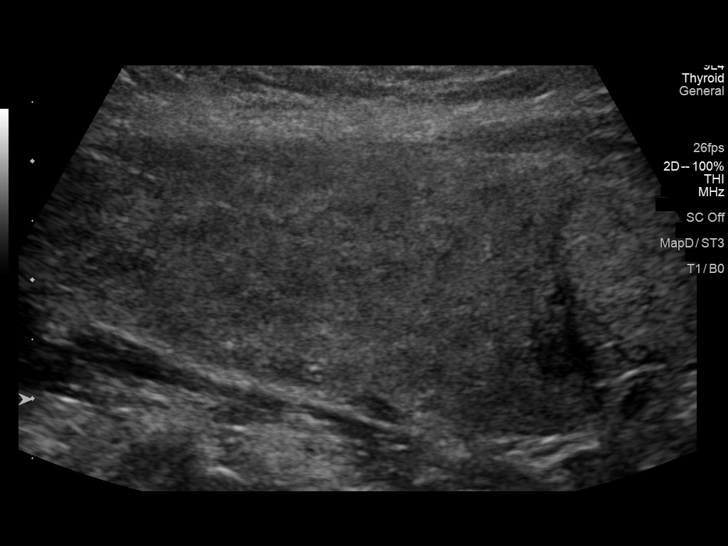
[im 26/44]
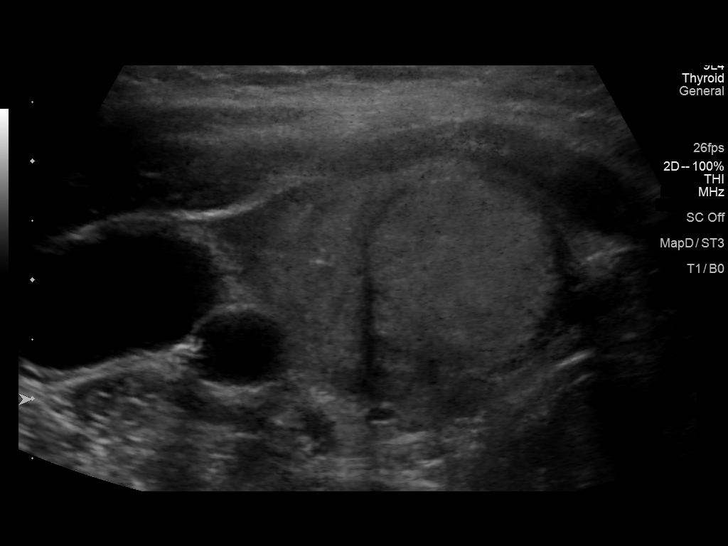
[im 29/44]
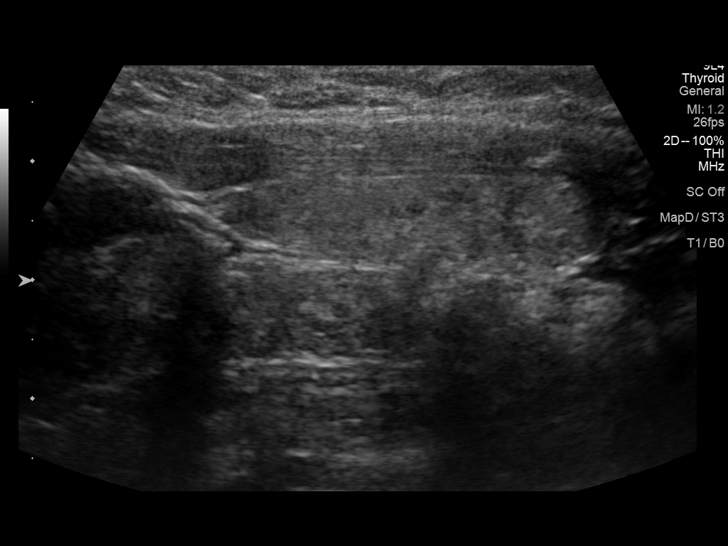
[im 33/44]
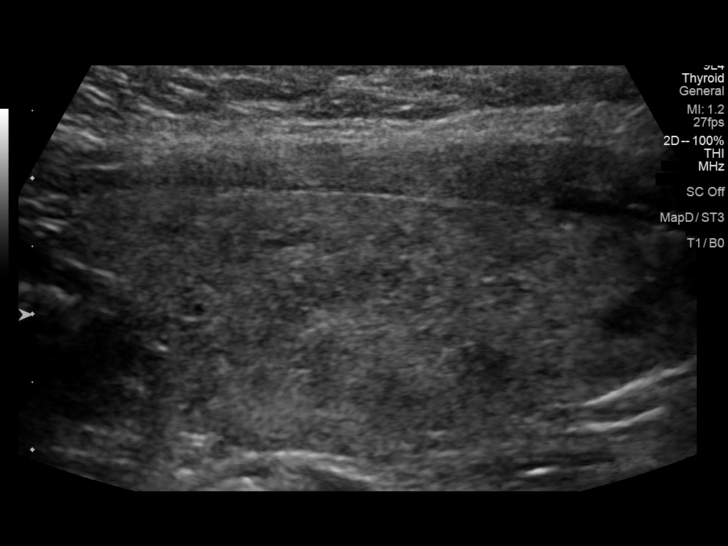
[im 36/44]
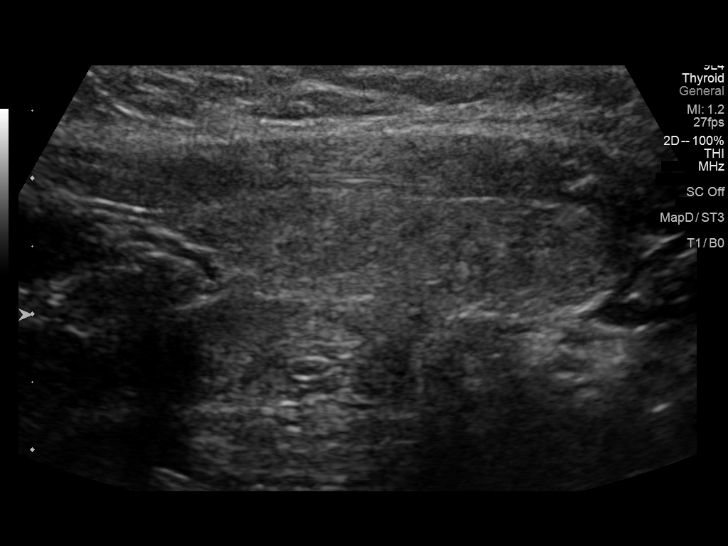
[im 40/44]
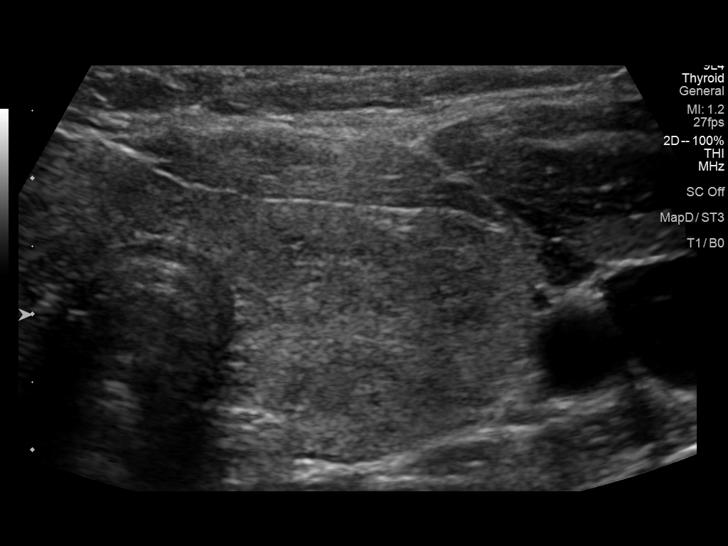
[im 44/44]
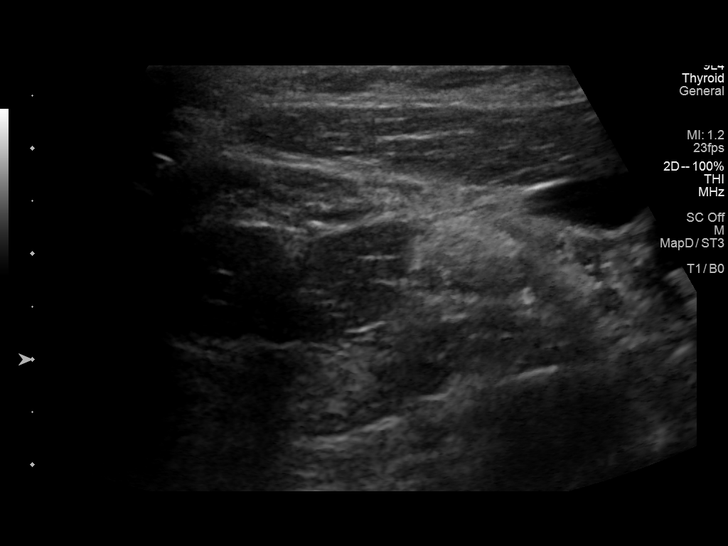

[13 of 25 positions shown; findings below may reference images not displayed]

FINDINGS: Parenchymal Echotexture: Moderately heterogenous

Isthmus: 0.4 cm

Right lobe: 6.1 x 2.5 x 2.2 cm

Left lobe: 5.4 x 1.9 x 2.3 cm

_________________________________________________________

Estimated total number of nodules >/= 1 cm: 2

Number of spongiform nodules >/=  2 cm not described below (TR1): 0

Number of mixed cystic and solid nodules >/= 1.5 cm not described
below (TR2): 0

_________________________________________________________

Nodule # 2: The previously biopsied nodule in the right inferior
gland is essentially unchanged at 2.3 x 1.7 x 1.6 cm compared to
x 1.7 x 1.6 cm previously.

Nodule # 1: A small isoechoic solid nodule in the thyroid isthmus
measures up to 1.3 cm. This nodule a does not meet criteria for
biopsy or imaging surveillance.
IMPRESSION: No significant interval change in the size or appearance of the
previously biopsied 2.3 cm nodule in the right inferior thyroid
gland.

The above is in keeping with the ACR TI-RADS recommendations - [HOSPITAL] 5950;[DATE].

## 2020-04-28 NOTE — Progress Notes (Signed)
Pt presents for annual, pap, and all STD testing.  Normal mammogram 11/2019 GAD = 7

## 2020-04-28 NOTE — Progress Notes (Addendum)
Subjective:        Tonya Stokes is a 44 y.o. female here for a routine exam.  Current complaints: none.    Personal health questionnaire:  Is patient [name], have a family history of breast and/or ovarian cancer: no Is there a family history of uterine cancer diagnosed at age < 57, gastrointestinal cancer, urinary tract cancer, family member who is a Field seismologist syndrome-associated carrier: yes Is the patient overweight and hypertensive, family history of diabetes, personal history of gestational diabetes, preeclampsia or PCOS: no Is patient over 27, have PCOS,  family history of premature CHD under age 24, diabetes, smoke, have hypertension or peripheral artery disease:  no At any time, has a partner hit, kicked or otherwise hurt or frightened you?: no Over the past 2 weeks, have you felt down, depressed or hopeless?: no Over the past 2 weeks, have you felt little interest or pleasure in doing things?:no   Gynecologic History Patient's last menstrual period was 04/22/2020. Contraception: none Last Pap: 11-07-2018. Results were: normal Last mammogram: 11-2019. Results were: normal  Obstetric History OB History  Gravida Para Term Preterm AB Living  2         2  SAB TAB Ectopic Multiple Live Births          2    # Outcome Date GA Lbr Len/2nd Weight Sex Delivery Anes PTL Lv  2 Gravida      CS-LVertical     1 Gravida      CS-LVertical       History reviewed. No pertinent past medical history.  Past Surgical History:  Procedure Laterality Date  . CESAREAN SECTION  09/02/2007  . CESAREAN SECTION  02/27/2011     Current Outpatient Medications:  .  methimazole (TAPAZOLE) 5 MG tablet, Take 1 tablet (5 mg total) by mouth daily. With a meal., Disp: 45 tablet, Rfl: 3 .  atenolol (TENORMIN) 25 MG tablet, Take 1 tablet (25 mg total) by mouth 2 (two) times daily. (Patient not taking: Reported on 04/28/2020), Disp: 90 tablet, Rfl: 3 No Known Allergies  Social History    Tobacco Use  . Smoking status: Never Smoker  . Smokeless tobacco: Never Used  Substance Use Topics  . Alcohol use: No    Family History  Problem Relation Age of Onset  . Irregular heart beat Mother   . Breast cancer Neg Hx       Review of Systems  Constitutional: negative for fatigue and weight loss Respiratory: negative for cough and wheezing Cardiovascular: negative for chest pain, fatigue and palpitations Gastrointestinal: negative for abdominal pain and change in bowel habits Musculoskeletal:negative for myalgias Neurological: negative for gait problems and tremors Behavioral/Psych: negative for abusive relationship, depression Endocrine: negative for temperature intolerance    Genitourinary:negative for abnormal menstrual periods, genital lesions, hot flashes, sexual problems and vaginal discharge Integument/breast: negative for breast lump, breast tenderness, nipple discharge and skin lesion(s)    Objective:       BP 132/85   Pulse 68   Ht 5\' 5"  (1.651 m)   Wt 243 lb (110.2 kg)   LMP 04/22/2020   BMI 40.44 kg/m  General:   alert  Skin:   no rash or abnormalities  Lungs:   clear to auscultation bilaterally  Heart:   regular rate and rhythm, S1, S2 normal, no murmur, click, rub or gallop  Breasts:   normal without suspicious masses, skin or nipple changes or axillary nodes  Abdomen:  normal findings: no  organomegaly, soft, non-tender and no hernia  Pelvis:  External genitalia: normal general appearance Urinary system: urethral meatus normal and bladder without fullness, nontender Vaginal: normal without tenderness, induration or masses Cervix: normal appearance Adnexa: normal bimanual exam Uterus: anteverted and non-tender, normal size   Lab Review Urine pregnancy test Labs reviewed yes Radiologic studies reviewed yes  50% of 20 min visit spent on counseling and coordination of care.   Assessment:     1. Encounter for routine gynecological  examination with Papanicolaou smear of cervix Rx: - Cytology - PAP( [place])  2. Vaginal discharge Rx: - Cervicovaginal ancillary only( [place])  3. Screening for STD (sexually transmitted disease) Rx: - Hepatitis B surface antigen - Hepatitis C antibody - HIV Antibody (routine testing w rflx) - RPR  4. Graves disease - managed by PCP  5. Class 3 severe obesity due to excess calories without serious comorbidity with body mass index (BMI) of 40.0 to 44.9 in adult [facility]) - program of caloric reduction, exercise and behavioral modification recommended    Plan:    Education reviewed: calcium supplements, depression evaluation, low fat, low cholesterol diet, safe sex/STD prevention, self breast exams and weight bearing exercise. Contraception: none. Follow up in: 1 year.    Orders Placed This Encounter  Procedures  . Hepatitis B surface antigen  . Hepatitis C antibody  . HIV Antibody (routine testing w rflx)  . RPR    Brock Bad, MD 04/28/2020 9:18 AM

## 2020-04-29 LAB — CYTOLOGY - PAP
Adequacy: ABSENT
Comment: NEGATIVE
Diagnosis: NEGATIVE
High risk HPV: NEGATIVE

## 2020-04-29 LAB — CERVICOVAGINAL ANCILLARY ONLY
Bacterial Vaginitis (gardnerella): NEGATIVE
Candida Glabrata: NEGATIVE
Candida Vaginitis: NEGATIVE
Chlamydia: NEGATIVE
Comment: NEGATIVE
Comment: NEGATIVE
Comment: NEGATIVE
Comment: NEGATIVE
Comment: NEGATIVE
Comment: NORMAL
Neisseria Gonorrhea: NEGATIVE
Trichomonas: NEGATIVE

## 2020-04-29 LAB — HEPATITIS C ANTIBODY: Hep C Virus Ab: <0.1 s/co ratio (ref 0.0–0.9)

## 2020-04-29 LAB — RPR: RPR Ser Ql: NONREACTIVE

## 2020-04-29 LAB — HIV ANTIBODY (ROUTINE TESTING W REFLEX): HIV Screen 4th Generation wRfx: NONREACTIVE

## 2020-04-29 LAB — HEPATITIS B SURFACE ANTIGEN: Hepatitis B Surface Ag: NEGATIVE

## 2020-05-27 ENCOUNTER — Other Ambulatory Visit: Payer: Self-pay

## 2020-05-27 ENCOUNTER — Encounter: Payer: Self-pay | Admitting: Internal Medicine

## 2020-05-27 ENCOUNTER — Ambulatory Visit (INDEPENDENT_AMBULATORY_CARE_PROVIDER_SITE_OTHER): Payer: Managed Care, Other (non HMO) | Admitting: Internal Medicine

## 2020-05-27 VITALS — BP 122/80 | HR 70 | Ht 65.0 in | Wt 242.0 lb

## 2020-05-27 DIAGNOSIS — E0500 Thyrotoxicosis with diffuse goiter without thyrotoxic crisis or storm: Secondary | ICD-10-CM | POA: Diagnosis not present

## 2020-05-27 DIAGNOSIS — E041 Nontoxic single thyroid nodule: Secondary | ICD-10-CM

## 2020-05-27 LAB — T3, FREE: T3, Free: 3.5 pg/mL (ref 2.3–4.2)

## 2020-05-27 LAB — T4, FREE: Free T4: 0.71 ng/dL (ref 0.60–1.60)

## 2020-05-27 LAB — TSH: TSH: 1.17 u[IU]/mL (ref 0.35–4.50)

## 2020-05-27 MED ORDER — METHIMAZOLE 5 MG PO TABS: 5.0000 mg | ORAL_TABLET | Freq: Every day | ORAL | 3 refills | Status: DC

## 2020-05-27 MED ORDER — ATENOLOL 25 MG PO TABS: 25.0000 mg | ORAL_TABLET | Freq: Every day | ORAL | 3 refills | Status: DC

## 2020-05-27 NOTE — Progress Notes (Signed)
Patient ID: Mariyanna Mucha, female   DOB: 05/26/1976, 44 y.o.   MRN: [id number]   This visit occurred during the SARS-CoV-2 public health emergency.  Safety protocols were in place, including screening questions prior to the visit, additional usage of staff PPE, and extensive cleaning of exam room while observing appropriate contact time as indicated for disinfecting solutions.    HPI  Adaria Hole is a 44 y.o.-year-old female, originally from [facility], initially referred by her PCP, Dr. Leavy Cella, returning for follow-up for a right thyroid nodule and subclinical thyrotoxicosis/Graves' disease.  Last visit 4 months ago.  Patient saw her PCP in 2019 for annual physical exam, during which a right thyroid nodule was palpated.  She did not have any neck compression symptoms at that time.  She had a thyroid ultrasound in 2019 and this showed a 2.3 cm nodule.  Repeat ultrasound of months later confirmed the nodule.  Reviewed thyroid nodule investigation:  Thyroid U/S (02/15/2018): Mildly heterogeneous thyroid, with right inferior 2.3 x 1.3 x 2 cm solid/almost completely solid, isoechoic nodule, with punctate echogenic foci - ACR TI-RADS score: 6 - FNA recommended.  Thyroid U/S (03/13/2018): Parenchymal Echotexture:  Mildly heterogenous Isthmus: 4 mm Right lobe: 6.6 x 2.0 x 2.6 cm Left lobe: 4.5 x 1.9 x 2.3 cm ___________________________________________  Nodule # 1: Location: Right; Inferior Maximum size: 2.4, previously 2.3 cm; Other 2 dimensions: 1.7 x 1.6, previously 1.3 x 2.0 cm Composition: solid/almost completely solid (2) Echogenicity: isoechoic (1) Shape: not taller-than-wide (0) Margins: ill-defined (0) Echogenic foci: punctate echogenic foci (3) ACR TI-RADS total points: 6.  **Given size (>/= 1.5 cm) and appearance, fine needle aspiration of this moderately suspicious nodule should be considered based on TI-RADS  criteria. _________________________________________________________  The isthmus and left inferior lobe also demonstrate subcentimeter hypoechoic and isoechoic nodules, all measuring 9 mm or less. These would not meet criteria for any biopsy or follow-up. Normal vascularity. Mild nonspecific thyroid heterogeneity. No adenopathy.  IMPRESSION: 2.4 cm right inferior TR 4 nodule meets criteria for biopsy as above.    FNA of this nodule (04/11/2018): AUS/FLUS, but the sample was not sent for Afirma molecular marker  Thyroid uptake and scan (10/16/2019):  Cold nodule at inferior pole of RIGHT thyroid lobe, corresponding to nodule seen on ultrasound. Uptake of tracer within remaining thyroid tissue is homogeneous. No additional areas of increased or decreased tracer localization seen.  4 hour I-123 uptake = 19.4% (normal 5-20%) 24 hour I-123 uptake = 41.3% (normal 10-30%)  IMPRESSION: Cold nodule at inferior pole of RIGHT thyroid lobe corresponding to nodule identified by prior ultrasound and previously biopsied in 2019, recommend correlation with pathology.  Elevated 24 hour radio iodine uptake consistent with hyperthyroidism.  Thyroid U/S (11/18/2019): Parenchymal Echotexture: Moderately heterogenous Isthmus: 0.4 cm Right lobe: 6.1 x 2.5 x 2.2 cm Left lobe: 5.4 x 1.9 x 2.3 cm ____________________________________________________  Nodule # 2: The previously biopsied nodule in the right inferior gland is essentially unchanged at 2.3 x 1.7 x 1.6 cm compared to 2.4 x 1.7 x 1.6 cm previously.  Nodule # 1: A small isoechoic solid nodule in the thyroid isthmus measures up to 1.3 cm. This nodule a does not meet criteria for biopsy or imaging surveillance.  IMPRESSION: No significant interval change in the size or appearance of the previously biopsied 2.3 cm nodule in the right inferior thyroid gland.  FNA (01/30/2020):  Clinical History: Nodule #2: The previously biopsied  nodule in the right  inferio gland is essentially  unchanged at 2.3 x 1.7 x 1.6cm compared to  2.4 x 1.7 x 1.6cm previously, 04/21/18 [facility]  Specimen Submitted: A. THYROID, RLP, FINE NEEDLE ASPIRATION:    FINAL MICROSCOPIC DIAGNOSIS:  - Follicular lesion of undetermined significance ([facility] category III)    SPECIMEN ADEQUACY:  Satisfactory for evaluation   DIAGNOSTIC COMMENTS:  This specimen will be sent for Afirma testing.   AFIRMA: Benign (risk of malignancy approximately 4%).  Pt denies: - feeling nodules in neck - hoarseness - dysphagia - choking - SOB with lying down  Graves' disease: - dx'ed in 2020  Reviewed patient's TFTs: Lab Results  Component Value Date   TSH <0.01 (L) 01/22/2020   TSH <0.01 (L) 09/20/2019   TSH 4.06 03/25/2016   FREET4 1.18 01/22/2020   FREET4 1.14 09/20/2019  06/06/2019: TSH 0.040, free T4 1.1 (0.6-1.4), total T3 148 (62-194) 02/07/2018: TSH 5.09 (0.45-5.33)  Her Graves' antibodies were elevated: Lab Results  Component Value Date   TSI 205 (H) 09/20/2019   We started methimazole 5 mg daily in 09/2019.  She did not return for follow-up labs afterwards. At last visit, she mentioned that she actually stopped the methimazole before the uptake and scan and did not restart it afterwards as she did not see my My chart message... At last visit, her thyroid tests were in the thyrotoxic range so we restarted methimazole 5 mg once a day.  She continues on this.  Pt denies: - weight loss - heat intolerance - tremors - palpitations - anxiety - hyperdefecation - hair loss  No FH of thyroid cancer. No h/o radiation tx to head or neck.  No seaweed or kelp. No recent contrast studies. No herbal supplements. No Biotin use. No recent steroids use.    ROS: Constitutional: + See HPI Eyes: no blurry vision, no xerophthalmia ENT: no sore throat, + see HPI Cardiovascular: no CP/no SOB/no palpitations/no leg swelling Respiratory: no cough/no  SOB/no wheezing Gastrointestinal: no N/no V/no D/no C/no acid reflux Musculoskeletal: no muscle aches/no joint aches Skin: no rashes, no hair loss Neurological: no tremors/no numbness/no tingling/no dizziness  I reviewed pt's medications, allergies, PMH, social hx, family hx, and changes were documented in the history of present illness. Otherwise, unchanged from my initial visit note.  No past medical history on file. Past Surgical History:  Procedure Laterality Date  . CESAREAN SECTION  09/02/2007  . CESAREAN SECTION  02/27/2011   Social History   Socioeconomic History  . Marital status: Married    Spouse name: Not on file  . Number of children: 42-72 and 69 years old in 09/2019  . Years of education: Not on file  . Highest education level: Not on file  Occupational History  . 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 Financial resource strain: Not on file  . Food insecurity    Worry: Not on file    Inability: Not on file  . Transportation needs    Medical: Not on file    Non-medical: Not on file  Tobacco Use  . Smoking status: Never Smoker  . Smokeless tobacco: Never Used  Substance and Sexual Activity  . Alcohol use: No  . Drug use: No  Lifestyle  Relationships  Social History Narrative   epworth sleepiness scale = 5 (03/11/16)   She takes ibuprofen and allergy medications.  No prescription medications  Current Outpatient Medications on File Prior to Visit  Medication Sig Dispense Refill  . atenolol (TENORMIN) 25 MG tablet  Take 1 tablet (25 mg total) by mouth 2 (two) times daily. (Patient not taking: Reported on 04/28/2020) 90 tablet 3  . methimazole (TAPAZOLE) 5 MG tablet Take 1 tablet (5 mg total) by mouth daily. With a meal. 45 tablet 3   No current facility-administered medications on file prior to visit.   No Known Allergies   Family History  Problem Relation Age of Onset  . Irregular heart beat Mother   . Breast cancer Neg Hx   + Heart  disease and HL in mother.  PE: BP 122/80   Pulse 70   Ht 5\' 5"  (1.651 m)   Wt 242 lb (109.8 kg)   SpO2 97%   BMI 40.27 kg/m  Wt Readings from Last 3 Encounters:  05/27/20 242 lb (109.8 kg)  04/28/20 243 lb (110.2 kg)  01/22/20 238 lb (108 kg)   Constitutional: overweight, in NAD Eyes: PERRLA, EOMI, no exophthalmos ENT: moist mucous membranes, no thyromegaly but large thyroid nodule palpated in the right thyroid lobe and isthmus, no cervical lymphadenopathy Cardiovascular: RRR, No MRG, + B periankle edema Respiratory: CTA B Gastrointestinal: abdomen soft, NT, ND, BS+ Musculoskeletal: no deformities, strength intact in all 4 Skin: moist, warm, no rashes Neurological: no tremor with outstretched hands, DTR normal in all 4  ASSESSMENT: 1. Thyroid nodule -in inferior right lobe  2.  Subclinical thyrotoxicosis  PLAN: 1. Thyroid nodule -I reviewed the images and report of her thyroid ultrasounds along with the patient.  She had a large dominant nodule which was biopsied in 04/2018 with inconclusive results.  At that time, unfortunately, the results were not sent for Afirma molecular marker.  We checked another thyroid ultrasound and the nodule appeared stable.  An uptake and scan showed that the nodule is actually cold so we discussed about the possible risk of cancer despite the isoechoic appearance, the lack of internal blood flow or other concerning features.  She also did not have a family history of thyroid cancer or personal history of radiation therapy to head or neck.  An FNA returned inconclusive, however, the Afirma molecular marker returned benign and I explained that this is associated with a low risk for cancer, of up to 4%. -She has no neck compression symptoms -We will continue to follow the nodule clinically and will repeat the ultrasound in 1 to 2 years from the previous  2.  Graves disease -Manifested as subclinical thyrotoxicosis -Patient had normal thyroid function  test in 2017 and 2019 however, in 06/2019, TSH was suppressed.  This was confirmed at our last visit in 09/2019.  Free T4 and free T3 are normal -Her TSI antibodies are also elevated -The thyroid scan showed homogeneous scan except for a cold nodule and also elevated uptake -We started methimazole 5 mg daily in the past but she did not return for labs and stopped the medication right after her thyroid uptake and scan.  At last visit, her tests were still thyrotoxic so we restarted methimazole 5 mg daily.  She again did not return for labs... -She denies thyrotoxic symptoms: Weight loss, heat intolerance, hyper defecation, anxiety, but at this visit, she does mention palpitations.  At last visit her pulse was high, at 115 bpm so we started atenolol 1-2 times a day. She takes it 2x a day. We discussed that if the labs are normal today, she can decrease the dose to 1x a day as her pulse is normal now -We will recheck a TSH, free T4, free T3  today and change the methimazole dose accordingly -Discussed about possible treatment for Graves' disease to include methimazole, radioactive iodine ablation or, last resort, surgery.  For now we will continue methimazole.  Of note, she is open to RAI treatment, if needed -I will see her back in 4 months -she would like to be called wit results  Needs refills.  Component     Latest Ref Rng & Units 05/27/2020  TSH     0.35 - 4.50 uIU/mL 1.17  T4,Free(Direct)     0.60 - 1.60 ng/dL 0.71  Triiodothyronine,Free,Serum     2.3 - 4.2 pg/mL 3.5   We will decrease the atenolol to only once a day as discussed above.  For now, we will continue the same dose of methimazole.  Philemon Kingdom, MD PhD [facility]

## 2020-05-27 NOTE — Patient Instructions (Signed)
Please stop at the lab.  Please continue: - Methimazole 5 mg daily - Atenolol 25 mg 2x a day  If your thyroid tests are normal, try to decrease Atenolol to only 1x a day.  Please come back for a follow-up appointment in 4 months.

## 2020-07-20 ENCOUNTER — Encounter: Payer: Self-pay | Admitting: *Deleted

## 2020-07-20 ENCOUNTER — Other Ambulatory Visit: Payer: Self-pay

## 2020-07-20 ENCOUNTER — Encounter: Payer: Self-pay | Admitting: Cardiology

## 2020-07-20 ENCOUNTER — Ambulatory Visit (INDEPENDENT_AMBULATORY_CARE_PROVIDER_SITE_OTHER): Payer: Managed Care, Other (non HMO) | Admitting: Cardiology

## 2020-07-20 VITALS — BP 138/82 | HR 75 | Ht 65.0 in | Wt 242.2 lb

## 2020-07-20 DIAGNOSIS — E0500 Thyrotoxicosis with diffuse goiter without thyrotoxic crisis or storm: Secondary | ICD-10-CM | POA: Diagnosis not present

## 2020-07-20 DIAGNOSIS — R002 Palpitations: Secondary | ICD-10-CM | POA: Diagnosis not present

## 2020-07-20 DIAGNOSIS — R011 Cardiac murmur, unspecified: Secondary | ICD-10-CM

## 2020-07-20 DIAGNOSIS — M25473 Effusion, unspecified ankle: Secondary | ICD-10-CM

## 2020-07-20 DIAGNOSIS — R030 Elevated blood-pressure reading, without diagnosis of hypertension: Secondary | ICD-10-CM | POA: Diagnosis not present

## 2020-07-20 NOTE — Progress Notes (Signed)
Primary Care Provider: Verlon Au, MD --> [facility] [place]) Cardiologist: Bryan Lemma, MD Electrophysiologist: None  Clinic Note: Chief Complaint  Patient presents with  . New Patient (Initial Visit)    Ankle edema  . Tachycardia    HPI:    Tonya Stokes is a 44 y.o. female who is being seen today for the evaluation of TACHYCARDIA and ANKLE SWELLING with HISTORY OF HEART MURMUR at the request of Verlon Au, MD.  Tonya Stokes was actually seen twice in 2017 by Dr. Rennis Golden here in this office in response to the ER visit for chest pain.  She had ruled out for MI.  There is suggestion of borderline cardiomegaly on chest x-ray not seen on echocardiogram or repeat chest x-ray.  She described chest pain symptoms associated with stress and anxiousness.  No palpitations. ->  Relatively normal echocardiogram and GXT. -> She was seen a couple times by our clinical pharmacy team for monitoring of blood pressures.  (Dr. Rennis Golden recommended chlorthalidone)  Tonya Stokes was last seen in April 2017 by Dr. Leavy Cella complaining of 1 month history of bilateral ankle swelling that occurs when she stands for long period time.  This is accompanied by tachycardia-heart beating very fast -> described as heart revving up as if she is "startled ".  Swelling is noted when she is on her feet for a long period of time, and relieved by foot elevation.  No PND orthopnea..   Recent Hospitalizations: None  Reviewed  CV studies:    The following studies were reviewed today: (if available, images/films reviewed: From Epic Chart or Care Everywhere) . GXT/ETT (04/05/2016): Exercised 7 min = 8.5 METS.  Peak HR 166 bpm equals 92% MPHR (179 bpm).  Mild hypertensive response.  Upsloping ST segments-nondiagnostic.  Negative/adequate ETT .  Echo (03/22/16): Technically difficult study.  EF 60 to 65%.  Normal wall motion and diastolic parameters.  Aortic valve not well  visualized minimally increased velocity across the valve.  Interval History:   Tonya Stokes returns here now again as a new patient with complaints of relatively frequent palpitations and ankle swelling. Ankle swelling happens when she has been on her feet all night long during her shift.  It goes down after she lies down in her bed.  She is not associated with any PND orthopnea.  No exertional dyspnea.  Does not necessarily pay attention to her salt intake but does drink plenty of water.  No complaints of varicose veins or discoloration of skin.  Rapid palpitations are relatively frequent maybe 4-5 times a day.  They can last anywhere from 15 seconds up to a minute.  She says that this is though she is startled or scared and her heart rate is gone up.  As soon as it goes up, goes back down again.  She does get a little dizzy if it lasts longer than 30 seconds, but is just an unusual sensation that scares her.  She does not have any syncope or near syncope type symptoms.  CV Review of Symptoms (Summary) Cardiovascular ROS: no chest pain or dyspnea on exertion positive for - edema, palpitations and rapid heart rate negative for - orthopnea, paroxysmal nocturnal dyspnea, shortness of breath or Syncope/near syncope or TIA/amaurosis fugax  The patient does not have symptoms concerning for COVID-19 infection (fever, chills, cough, or new shortness of breath).  The patient is practicing social distancing & Masking.    REVIEWED OF SYSTEMS   Review  of Systems  Constitutional: Positive for malaise/fatigue (At the end of a long shift she is very tired, but has a hard time adjusting to her night shift sleep habits). Negative for weight loss.  HENT: Negative for congestion.   Respiratory: Negative for cough and shortness of breath.   Cardiovascular: Positive for palpitations and leg swelling.  Gastrointestinal: Negative for blood in stool and melena.  Genitourinary: Negative for frequency and  hematuria.  Musculoskeletal: Negative for joint pain.  Neurological: Negative for dizziness, focal weakness and headaches.  Psychiatric/Behavioral: The patient is not nervous/anxious (She does not have any anxiety, but the palpitations feel as though she is having an anxiety attack.).    I have reviewed and (if needed) personally updated the patient's problem list, medications, allergies, past medical and surgical history, social and family history.   PAST MEDICAL HISTORY   Past Medical History:  Diagnosis Date  . Graves disease    On atenolol and methimazole  . History of cardiac murmur    Per chart review -> 2D echo in April 2017 suggested increased velocities across aortic valve.  No valvular or subvalvular stenosis.  . Obesity, Class III, BMI 40-49.9 (morbid obesity) (HCC)    BMI 40.3  . Prediabetes   . Sickle cell trait (HCC)    Per chart review    PAST SURGICAL HISTORY   Past Surgical History:  Procedure Laterality Date  . CESAREAN SECTION  09/02/2007  . CESAREAN SECTION  02/27/2011  . GXT/ETT  04/2016    Exercised 7 min = 8.5 METS.  Peak HR 166 bpm equals 92% MPHR (179 bpm).  Mild hypertensive response.  Upsloping ST segments-nondiagnostic.  NEGATIVE/ADEQUATE ETT  . TRANSTHORACIC ECHOCARDIOGRAM  03/2016   Technically difficult study.  EF 60 to 65%.  Normal wall motion and diastolic parameters.  Aortic valve not well visualized minimally increased velocity across the valve.    MEDICATIONS/ALLERGIES   Current Meds  Medication Sig  . atenolol (TENORMIN) 25 MG tablet Take 1 tablet (25 mg total) by mouth daily.  . methimazole (TAPAZOLE) 5 MG tablet Take 1 tablet (5 mg total) by mouth daily. With a meal.    No Known Allergies  SOCIAL HISTORY/FAMILY HISTORY   Social History   Tobacco Use  . Smoking status: Never Smoker  . Smokeless tobacco: Never Used  Vaping Use  . Vaping Use: Never used  Substance Use Topics  . Alcohol use: No  . Drug use: No   Social History     Social History Narrative   epworth sleepiness scale = 5 (03/11/16);   Nil is a native of the [place].  She moved here to [place] with her husband who runs his own business.   She does not drink coffee, but does drink tea and cocoa.      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   family history includes Irregular heart beat in her mother; Other in her father.  (Father died at early age of unknown cause)   OBJCTIVE -PE, EKG, labs   Wt Readings from Last 3 Encounters:  07/20/20 242 lb 3.2 oz (109.9 kg)  05/27/20 242 lb (109.8 kg)  04/28/20 243 lb (110.2 kg)    Physical Exam: BP 138/82   Pulse 75   Ht  (1.651 m)   Wt 242 lb 3.2 oz (109.9 kg)   BMI 40.30 kg/m  Physical Exam Constitutional:      General: She is not in acute distress.  Appearance: Normal appearance. She is obese. She is not ill-appearing.  HENT:     Head: Normocephalic and atraumatic.  Neck:     Vascular: No carotid bruit.     Comments: No JVD Cardiovascular:     Rate and Rhythm: Normal rate and regular rhythm.  No extrasystoles are present.    Chest Wall: PMI is not displaced (Unable to palpate).     Pulses: Normal pulses.     Heart sounds: S1 normal and S2 normal. Heart sounds are distant. Murmur (Very soft 1/6 SEM at RUSB) heard.  No friction rub. No gallop.   Pulmonary:     Effort: Pulmonary effort is normal. No respiratory distress.     Breath sounds: Normal breath sounds.  Musculoskeletal:        General: Swelling (Trivial bilateral ankle) present. Normal range of motion.     Cervical back: Normal range of motion.  Neurological:     General: No focal deficit present.     Mental Status: She is alert and oriented to person, place, and time.  Psychiatric:        Mood and Affect: Mood normal.        Behavior: Behavior normal.        Thought Content: Thought content normal.        Judgment: Judgment normal.     Adult ECG Report EKG from PCP 06/10/2020: Sinus rhythm 63 bpm.   Moderate voltage for LVH.  Inferior and lateral T wave inversions, consider ischemia.  Rate: 75 ;  Rhythm: normal sinus rhythm and Specific SEM and T wave changes.  Inferolateral T wave inversions;   Narrative Interpretation: Similar findings as seen on PCP EKG   Recent Labs: June 10, 2020  Na+ 139, K+ 3.5, Cl- 107, HCO3-27, BUN 9, Cr 0.74, Glu 124, Ca2+ 8.5; AST 23, ALT 25, AlkP 74; BNP 31  CBC: W 8.2, H/H 13.6/40.7, Plt 174  TC 171, TG 169, HDL 44, LDL 93; A1c 6.2   ASSESSMENT/PLAN    Problem List Items Addressed This Visit    Elevated blood pressure reading without diagnosis of hypertension (Chronic)    Blood pressure is borderline at 138/82.  She is a little bit stressed to be here with a cardiologist.  We can reassess in follow-up.  I have asked that she checks her blood pressure every now at work.  If there is reason for treating palpitations, would probably increase atenolol dose, if not, could probably consider diuretic-especially in light of her edema.      Graves disease (Chronic)    She is on beta-blocker and methimazole.  I do not know the history of this, but it is possible that her hyperthyroidism could be triggering tachycardia spells. Multiple labs were checked, but TSH was not checked back in July.  She is followed by an endocrinologist, will defer.      Murmur    Probably a flow murmur across the aortic valve.  She needs to stay adequately hydrated.      Rapid palpitations - Primary    Short bursts of heart rate spells lasting 30 seconds to a minute as if she is startled.  These sound like they are very well could be short little bursts of PSVT or PAT.  See is on beta-blocker and methimazole for Graves' disease.  This could be related to hyperthyroidism-> I recommend that she discuss with endocrinologist.  We will check a 3-day Zio patch to see what we can capture. --> May  want to consider increasing beta-blocker dose Also discussed trying to avoid triggers  such as tea and cocoa-minimize as much as possible. Stay adequately hydrated.      Relevant Orders   EKG 12-Lead (Completed)   LONG TERM MONITOR (3-14 DAYS)   Ankle swelling    She has mild end of day or end of shift lower extremity edema which goes down with raising her legs up.  She has no PND orthopnea.  She probably has venous pooling from mild venous insufficiency.  Not uncommon with this line of work being on her feet all shift long.  Does not sound like it is consistent with CHF.  Recommend avoiding salt--> provide salty 6 sheet Recommend foot elevation, ankle raising exercises and wearing support stockings.          COVID-19 Education: The signs and symptoms of COVID-19 were discussed with the patient and how to seek care for testing (follow up with PCP or arrange E-visit).   The importance of social distancing and COVID-19 vaccination was discussed today.  I spent a total of with the patient. >  50% of the time was spent in direct patient consultation.  Additional time spent with chart review  / charting (studies, outside notes, etc): 14 Total Time: 36 min   Current medicines are reviewed at length with the patient today.  (+/- concerns) n/a  Notice: This dictation was prepared with Dragon dictation along with smaller phrase technology. Any transcriptional errors that result from this process are unintentional and may not be corrected upon review.  Patient Instructions / Medication Changes & Studies & Tests Ordered   Patient Instructions  Medication Instructions:  The current medical regimen is effective;  continue present plan and medications.  *If you need a refill on your cardiac medications before your next appointment, please call your pharmacy*   Testing/Procedures: Your physician has recommended that you wear a 3 DAY ZIO-PATCH monitor. The Zio patch cardiac monitor continuously records heart rhythm data for up to 14 days, this is for patients being  evaluated for multiple types heart rhythms. For the first 24 hours post application, please avoid getting the Zio monitor wet in the shower or by excessive sweating during exercise. After that, feel free to carry on with regular activities. Keep soaps and lotions away from the ZIO XT Patch.  This will be mailed to you, please expect 7-10 days to receive.    Applying the monitor   Shave hair from upper left chest.   Follow-Up: At [facility], you and your health needs are our priority.  As part of our continuing mission to provide you with exceptional heart care, we have created designated Provider Care Teams.  These Care Teams include your primary Cardiologist (physician) and Advanced Practice Providers (APPs -  Physician Assistants and Nurse Practitioners) who all work together to provide you with the care you need, when you need it.  We recommend signing up for the patient portal called "MyChart".  Sign up information is provided on this After Visit Summary.  MyChart is used to connect with patients for Virtual Visits (Telemedicine).  Patients are able to view lab/test results, encounter notes, upcoming appointments, etc.  Non-urgent messages can be sent to your provider as well.   To learn more about what you can do with MyChart, go to [email or web address].    Your next appointment:   2 month(s)  The format for your next appointment:   Virtual Visit   Provider:  Bryan Lemmaavid Quirino Kakos, MD   Other Instructions  Elevate legs, and stay hydrated   How to Use Compression Stockings Compression stockings are elastic socks that squeeze the legs. They help increase blood flow (circulation) to the legs, decrease swelling in the legs, and reduce the chance of developing blood clots in the lower legs. Compression stockings are often used by people who:  Are recovering from surgery.  Have poor circulation in their legs.  Tend to get blood clots in their legs.  Have bulging (varicose)  veins.  Sit or stay in bed for long periods of time. Follow instructions from your health care provider about how and when to wear your compression stockings.  Studies Ordered:   Orders Placed This Encounter  Procedures  . LONG TERM MONITOR (3-14 DAYS)  . EKG 12-Lead     Bryan Lemmaavid Caitlynn Ju, M.D., M.S. Interventional Cardiologist   Pager # [phone] Phone # [phone] [address] [place], [place] [address]   Thank you for choosing [facility] at [facility]!!

## 2020-07-20 NOTE — Patient Instructions (Signed)
Medication Instructions:  The current medical regimen is effective;  continue present plan and medications.  *If you need a refill on your cardiac medications before your next appointment, please call your pharmacy*   Testing/Procedures: Your physician has recommended that you wear a 3 DAY ZIO-PATCH monitor. The Zio patch cardiac monitor continuously records heart rhythm data for up to 14 days, this is for patients being evaluated for multiple types heart rhythms. For the first 24 hours post application, please avoid getting the Zio monitor wet in the shower or by excessive sweating during exercise. After that, feel free to carry on with regular activities. Keep soaps and lotions away from the ZIO XT Patch.  This will be mailed to you, please expect 7-10 days to receive.    Applying the monitor   Shave hair from upper left chest.   Hold abrader disc by orange tab.  Rub abrader in 40 strokes over left upper chest as indicated in your monitor instructions.   Clean area with 4 enclosed alcohol pads .  Use all pads to assure are is cleaned thoroughly.  Let dry.   Apply patch as indicated in monitor instructions.  Patch will be place under collarbone on left side of chest with arrow pointing upward.   Rub patch adhesive wings for 2 minutes.Remove white label marked "1".  Remove white label marked "2".  Rub patch adhesive wings for 2 additional minutes.   While looking in a mirror, press and release button in center of patch.  A small green light will flash 3-4 times .  This will be your only indicator the monitor has been turned on.     Do not shower for the first 24 hours.  You may shower after the first 24 hours.   Press button if you feel a symptom. You will hear a small click.  Record Date, Time and Symptom in the Patient Log Book.   When you are ready to remove patch, follow instructions on last 2 pages of Patient Log Book.  Stick patch monitor onto last page of Patient Log Book.     Place Patient Log Book in [address] box.  Use locking tab on box and tape box closed securely.  The [employer] has [employer] on it.  Please place in mailbox as soon as possible.  Your physician should have your test results approximately 7 days after the monitor has been mailed back to [facility].   Call [place] at [phone] if you have questions regarding your ZIO XT patch monitor.  Call them immediately if you see an orange light blinking on your monitor.   If your monitor falls off in less than 4 days contact our Monitor department at [phone].  If your monitor becomes loose or falls off after 4 days call [employer] at [phone] for suggestions on securing your monitor     Follow-Up: At [facility], you and your health needs are our priority.  As part of our continuing mission to provide you with exceptional heart care, we have created designated Provider Care Teams.  These Care Teams include your primary Cardiologist (physician) and Advanced Practice Providers (APPs -  Physician Assistants and Nurse Practitioners) who all work together to provide you with the care you need, when you need it.  We recommend signing up for the patient portal called "MyChart".  Sign up information is provided on this After Visit Summary.  MyChart is used to connect with patients for Virtual  Visits (Telemedicine).  Patients are able to view lab/test results, encounter notes, upcoming appointments, etc.  Non-urgent messages can be sent to your provider as well.   To learn more about what you can do with MyChart, go to [email or web address].    Your next appointment:   2 month(s)  The format for your next appointment:   Virtual Visit   Provider:   Bryan Lemma, MD   Other Instructions  Elevate legs, and stay hydrated   How to Use Compression Stockings Compression stockings are elastic socks that squeeze the legs. They help increase blood flow  (circulation) to the legs, decrease swelling in the legs, and reduce the chance of developing blood clots in the lower legs. Compression stockings are often used by people who:  Are recovering from surgery.  Have poor circulation in their legs.  Tend to get blood clots in their legs.  Have bulging (varicose) veins.  Sit or stay in bed for long periods of time. Follow instructions from your health care provider about how and when to wear your compression stockings. How to wear compression stockings Before you put on your compression stockings:  Make sure that they are the correct size and degree of compression. If you do not know your size or required grade of compression, ask your health care provider and follow the manufacturer's instructions that come with the stockings.  Make sure that they are clean, dry, and in good condition.  Check them for rips and tears. Do not put them on if they are ripped or torn. Put your stockings on first thing in the morning, before you get out of bed. Keep them on for as long as your health care provider advises. When you are wearing your stockings:  Keep them as smooth as possible. Do not allow them to bunch up. It is especially important to prevent the stockings from bunching up around your toes or behind your knees.  Do not roll the stockings downward and leave them rolled down. This can decrease blood flow to your leg.  Change them right away if they become wet or dirty. When you take off your stockings, inspect your legs and feet. Check for:  Open sores.  Red spots.  Swelling. General tips  Do not stop wearing compression stockings without talking to your health care provider first.  Wash your stockings every day with mild detergent in cold or warm water. Do not use bleach. Air-dry your stockings or dry them in a clothes dryer on low heat. It may be helpful to have two pairs so that you have a pair to wear while the other is being  washed.  Replace your stockings every 3-6 months.  If skin moisturizing is part of your treatment plan, apply lotion or cream at night so that your skin will be dry when you put on the stockings in the morning. It is harder to put the stockings on when you have lotion on your legs or feet.  Wear nonskid shoes or slip-resistant socks when walking while wearing compression stockings. Contact a health care provider and remove your stockings if you have:  A feeling of pins and needles in your feet or legs.  Open sores, red spots, or other skin changes on your feet or legs.  Swelling or pain that gets worse. Get help right away if you have:  Numbness or tingling in your lower legs that does not get better right after you take the stockings off.  Toes or feet that  are unusually cold or turn a bluish color.  A warm or red area on your leg.  New swelling or soreness in your leg.  Shortness of breath.  Chest pain.  A fast or irregular heartbeat.  Light-headedness.  Dizziness. Summary  Compression stockings are elastic socks that squeeze the legs.  They help increase blood flow (circulation) to the legs, decrease swelling in the legs, and reduce the chance of developing blood clots in the lower legs.  Follow instructions from your health care provider about how and when to wear your compression stockings.  Do not stop wearing your compression stockings without talking to your health care provider first. This information is not intended to replace advice given to you by your health care provider. Make sure you discuss any questions you have with your health care provider. Document Revised: 11/23/2017 Document Reviewed: 11/23/2017 Elsevier Patient Education  2020 [employer].

## 2020-07-20 NOTE — Progress Notes (Signed)
Patient ID: Tonya Stokes, female   DOB: [birth year], 44 y.o.   MRN: [id number] Patient enrolled for [employer] to ship a 3 day ZIO XT long term holter monitor to her home.

## 2020-07-21 ENCOUNTER — Encounter: Payer: Self-pay | Admitting: Cardiology

## 2020-07-22 NOTE — Assessment & Plan Note (Signed)
She has mild end of day or end of shift lower extremity edema which goes down with raising her legs up.  She has no PND orthopnea.  She probably has venous pooling from mild venous insufficiency.  Not uncommon with this line of work being on her feet all shift long.  Does not sound like it is consistent with CHF.  Recommend avoiding salt--> provide salty 6 sheet Recommend foot elevation, ankle raising exercises and wearing support stockings.

## 2020-07-22 NOTE — Assessment & Plan Note (Signed)
She is on beta-blocker and methimazole.  I do not know the history of this, but it is possible that her hyperthyroidism could be triggering tachycardia spells. Multiple labs were checked, but TSH was not checked back in July.  She is followed by an endocrinologist, will defer.

## 2020-07-22 NOTE — Assessment & Plan Note (Signed)
Short bursts of heart rate spells lasting 30 seconds to a minute as if she is startled.  These sound like they are very well could be short little bursts of PSVT or PAT.  See is on beta-blocker and methimazole for Graves' disease.  This could be related to hyperthyroidism-> I recommend that she discuss with endocrinologist.  We will check a 3-day Zio patch to see what we can capture. --> May want to consider increasing beta-blocker dose Also discussed trying to avoid triggers such as tea and cocoa-minimize as much as possible. Stay adequately hydrated.

## 2020-07-22 NOTE — Assessment & Plan Note (Signed)
Probably a flow murmur across the aortic valve.  She needs to stay adequately hydrated.

## 2020-07-22 NOTE — Assessment & Plan Note (Signed)
Blood pressure is borderline at 138/82.  She is a little bit stressed to be here with a cardiologist.  We can reassess in follow-up.  I have asked that she checks her blood pressure every now at work.  If there is reason for treating palpitations, would probably increase atenolol dose, if not, could probably consider diuretic-especially in light of her edema.

## 2020-07-28 ENCOUNTER — Ambulatory Visit (INDEPENDENT_AMBULATORY_CARE_PROVIDER_SITE_OTHER): Payer: Managed Care, Other (non HMO)

## 2020-07-28 DIAGNOSIS — R002 Palpitations: Secondary | ICD-10-CM

## 2020-09-14 ENCOUNTER — Other Ambulatory Visit: Payer: Self-pay

## 2020-09-14 MED ORDER — ATENOLOL 25 MG PO TABS: 25.0000 mg | ORAL_TABLET | Freq: Every day | ORAL | 3 refills | Status: DC

## 2020-09-14 MED ORDER — METHIMAZOLE 5 MG PO TABS: 5.0000 mg | ORAL_TABLET | Freq: Every day | ORAL | 3 refills | Status: DC

## 2020-09-14 NOTE — Telephone Encounter (Signed)
Medication has been sent and patient notified

## 2020-09-14 NOTE — Telephone Encounter (Signed)
New message    *STAT* If patient is at the pharmacy, call can be transferred to refill team.   1. Which medications need to be refilled? (please list name of each medication and dose if known)   atenolol (TENORMIN) 25 MG tablet methimazole (TAPAZOLE) 5 MG tablet  2. Which pharmacy/location (including street and city if local pharmacy) is medication to be sent to?[facility] [phone] - [place], [place] - [address]  3. Do they need a 30 day or 90 day supply? 90 days

## 2020-09-18 ENCOUNTER — Telehealth: Payer: Self-pay | Admitting: *Deleted

## 2020-09-18 ENCOUNTER — Telehealth: Payer: Managed Care, Other (non HMO) | Admitting: Cardiology

## 2020-09-18 NOTE — Telephone Encounter (Signed)
SECOND ATTEMPT - LEFT MESSAGE WILL TRY AGAIN ALSO TRIED SECOND NUMBER - WORK-- ANSWER MACHINE -DID NOT LEAVE MESSAGE

## 2020-09-18 NOTE — Telephone Encounter (Signed)
Third attempt . Left message cancelling appointment will need to call back to reschedule

## 2020-09-18 NOTE — Telephone Encounter (Signed)
First attempt - LEFT MESSAGE  WILL CALL BACK TO [facility] - TELEPHONE VISIT

## 2020-09-30 ENCOUNTER — Encounter: Payer: Self-pay | Admitting: Internal Medicine

## 2020-09-30 ENCOUNTER — Ambulatory Visit (INDEPENDENT_AMBULATORY_CARE_PROVIDER_SITE_OTHER): Payer: Managed Care, Other (non HMO) | Admitting: Internal Medicine

## 2020-09-30 ENCOUNTER — Other Ambulatory Visit: Payer: Self-pay

## 2020-09-30 VITALS — BP 124/80 | HR 79 | Ht 65.0 in | Wt 243.3 lb

## 2020-09-30 DIAGNOSIS — Z23 Encounter for immunization: Secondary | ICD-10-CM

## 2020-09-30 DIAGNOSIS — E0500 Thyrotoxicosis with diffuse goiter without thyrotoxic crisis or storm: Secondary | ICD-10-CM

## 2020-09-30 DIAGNOSIS — E041 Nontoxic single thyroid nodule: Secondary | ICD-10-CM

## 2020-09-30 LAB — TSH: TSH: 1.24 u[IU]/mL (ref 0.35–4.50)

## 2020-09-30 LAB — T4, FREE: Free T4: 0.71 ng/dL (ref 0.60–1.60)

## 2020-09-30 LAB — T3, FREE: T3, Free: 3.9 pg/mL (ref 2.3–4.2)

## 2020-09-30 NOTE — Progress Notes (Signed)
Patient ID: Tonya Stokes, female   DOB: 01/13/1976, 44 y.o.   MRN: [id number]   This visit occurred during the SARS-CoV-2 public health emergency.  Safety protocols were in place, including screening questions prior to the visit, additional usage of staff PPE, and extensive cleaning of exam room while observing appropriate contact time as indicated for disinfecting solutions.   HPI  Tonya Stokes is a 44 y.o.-year-old female, originally from [facility], initially referred by her PCP, Dr. Leavy Cella, returning for follow-up for a right thyroid nodule and subclinical thyrotoxicosis/Graves' disease.  Last visit 4 months ago.  Right thyroid nodule  -Palpated by PCP in 2019 during APE.  She did not have neck compression symptoms at that time.  Reviewed her thyroid nodule investigation:  Thyroid U/S (02/15/2018): Mildly heterogeneous thyroid, with right inferior 2.3 x 1.3 x 2 cm solid/almost completely solid, isoechoic nodule, with punctate echogenic foci - ACR TI-RADS score: 6 - FNA recommended.  Thyroid U/S (03/13/2018): Parenchymal Echotexture:  Mildly heterogenous Isthmus: 4 mm Right lobe: 6.6 x 2.0 x 2.6 cm Left lobe: 4.5 x 1.9 x 2.3 cm ___________________________________________  Nodule # 1: Location: Right; Inferior Maximum size: 2.4, previously 2.3 cm; Other 2 dimensions: 1.7 x 1.6, previously 1.3 x 2.0 cm Composition: solid/almost completely solid (2) Echogenicity: isoechoic (1) Shape: not taller-than-wide (0) Margins: ill-defined (0) Echogenic foci: punctate echogenic foci (3) ACR TI-RADS total points: 6.  **Given size (>/= 1.5 cm) and appearance, fine needle aspiration of this moderately suspicious nodule should be considered based on TI-RADS criteria. _________________________________________________________  The isthmus and left inferior lobe also demonstrate subcentimeter hypoechoic and isoechoic nodules, all measuring 9 mm or less. These would not meet criteria for  any biopsy or follow-up. Normal vascularity. Mild nonspecific thyroid heterogeneity. No adenopathy.  IMPRESSION: 2.4 cm right inferior TR 4 nodule meets criteria for biopsy as above.    FNA of this nodule (04/11/2018): AUS/FLUS, but the sample was not sent for Afirma molecular marker  Thyroid uptake and scan (10/16/2019):  Cold nodule at inferior pole of RIGHT thyroid lobe, corresponding to nodule seen on ultrasound. Uptake of tracer within remaining thyroid tissue is homogeneous. No additional areas of increased or decreased tracer localization seen.  4 hour I-123 uptake = 19.4% (normal 5-20%) 24 hour I-123 uptake = 41.3% (normal 10-30%)  IMPRESSION: Cold nodule at inferior pole of RIGHT thyroid lobe corresponding to nodule identified by prior ultrasound and previously biopsied in 2019, recommend correlation with pathology.  Elevated 24 hour radio iodine uptake consistent with hyperthyroidism.  Thyroid U/S (11/18/2019): Parenchymal Echotexture: Moderately heterogenous Isthmus: 0.4 cm Right lobe: 6.1 x 2.5 x 2.2 cm Left lobe: 5.4 x 1.9 x 2.3 cm ____________________________________________________  Nodule # 2: The previously biopsied nodule in the right inferior gland is essentially unchanged at 2.3 x 1.7 x 1.6 cm compared to 2.4 x 1.7 x 1.6 cm previously.  Nodule # 1: A small isoechoic solid nodule in the thyroid isthmus measures up to 1.3 cm. This nodule a does not meet criteria for biopsy or imaging surveillance.  IMPRESSION: No significant interval change in the size or appearance of the previously biopsied 2.3 cm nodule in the right inferior thyroid gland.  FNA (01/30/2020):  Clinical History: Nodule #2: The previously biopsied nodule in the right  inferio gland is essentially unchanged at 2.3 x 1.7 x 1.6cm compared to  2.4 x 1.7 x 1.6cm previously, 04/21/18 [facility]  Specimen Submitted: A. THYROID, RLP, FINE NEEDLE ASPIRATION:   FINAL MICROSCOPIC DIAGNOSIS:   -  Follicular lesion of undetermined significance ([facility] category III)   SPECIMEN ADEQUACY:  Satisfactory for evaluation   AFIRMA: Benign (risk of malignancy up to 4%).  Graves' disease: - dx'ed in 2020  Reviewed her TFTs: Lab Results  Component Value Date   TSH 1.17 05/27/2020   TSH <0.01 (L) 01/22/2020   TSH <0.01 (L) 09/20/2019   TSH 4.06 03/25/2016   FREET4 0.71 05/27/2020   FREET4 1.18 01/22/2020   FREET4 1.14 09/20/2019  06/06/2019: TSH 0.040, free T4 1.1 (0.6-1.4), total T3 148 (62-194) 02/07/2018: TSH 5.09 (0.45-5.33)  Graves' antibodies were elevated: Lab Results  Component Value Date   TSI 205 (H) 09/20/2019   We started methimazole 5 mg daily in 09/2019.  She came off the medication as she misunderstood instructions after her RAI treatment, but we restarted it afterwards.  She continues on the same dose.  She tolerates this well.  Pt denies: - feeling nodules in neck - hoarseness - dysphagia - choking - SOB with lying down  No FH of thyroid disease or cancer. No h/o radiation tx to head or neck.  No seaweed or kelp. No recent contrast studies. No herbal supplements. No Biotin use. No recent steroids use.   She saw cardiology >> had a heart monitor x 3 days >> will have a new appt in 2 days.  ROS: Constitutional: no weight gain/no weight loss, no fatigue, no subjective hyperthermia, no subjective hypothermia Eyes: no blurry vision, no xerophthalmia ENT: no sore throat, + see HPI Cardiovascular: no CP/no SOB/+ improved palpitations/no leg swelling Respiratory: no cough/no SOB/no wheezing Gastrointestinal: no N/no V/no D/no C/no acid reflux Musculoskeletal: no muscle aches/no joint aches Skin: no rashes, no hair loss Neurological: no tremors/no numbness/no tingling/no dizziness  I reviewed pt's medications, allergies, PMH, social hx, family hx, and changes were documented in the history of present illness. Otherwise, unchanged from my initial visit  note.  Past Medical History:  Diagnosis Date  . Graves disease    On atenolol and methimazole  . History of cardiac murmur    Per chart review -> 2D echo in April 2017 suggested increased velocities across aortic valve.  No valvular or subvalvular stenosis.  . Obesity, Class III, BMI 40-49.9 (morbid obesity) (HCC)    BMI 40.3  . Prediabetes   . Sickle cell trait (HCC)    Per chart review   Past Surgical History:  Procedure Laterality Date  . CESAREAN SECTION  09/02/2007  . CESAREAN SECTION  02/27/2011  . GXT/ETT  04/2016    Exercised 7 min = 8.5 METS.  Peak HR 166 bpm equals 92% MPHR (179 bpm).  Mild hypertensive response.  Upsloping ST segments-nondiagnostic.  NEGATIVE/ADEQUATE ETT  . TRANSTHORACIC ECHOCARDIOGRAM  03/2016   Technically difficult study.  EF 60 to 65%.  Normal wall motion and diastolic parameters.  Aortic valve not well visualized minimally increased velocity across the valve.   Social History   Socioeconomic History  . Marital status: Married    Spouse name: Not on file  . Number of children: 34-52 and 32 years old in 09/2019  . Years of education: Not on file  . Highest education level: Not on file  Occupational History  . 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 Financial resource strain: Not on file  . Food insecurity    Worry: Not on file    Inability: Not on file  . Transportation needs    Medical: Not on file  Non-medical: Not on file  Tobacco Use  . Smoking status: Never Smoker  . Smokeless tobacco: Never Used  Substance and Sexual Activity  . Alcohol use: No  . Drug use: No  Lifestyle  Relationships  Social History Narrative   epworth sleepiness scale = 5 (03/11/16)   She takes ibuprofen and allergy medications.  No prescription medications  Current Outpatient Medications on File Prior to Visit  Medication Sig Dispense Refill  . atenolol (TENORMIN) 25 MG tablet Take 1 tablet (25 mg total) by mouth daily. 90 tablet 3  .  methimazole (TAPAZOLE) 5 MG tablet Take 1 tablet (5 mg total) by mouth daily. With a meal. 90 tablet 3   No current facility-administered medications on file prior to visit.   No Known Allergies   Family History  Problem Relation Age of Onset  . Irregular heart beat Mother   . Other Father        Unknown, daughter early age  . Breast cancer Neg Hx   + Heart disease and HL in mother.  PE: BP 124/80   Pulse 79   Ht 5\' 5"  (1.651 m)   Wt 243 lb 4.8 oz (110.4 kg)   SpO2 98%   BMI 40.49 kg/m  Wt Readings from Last 3 Encounters:  09/30/20 243 lb 4.8 oz (110.4 kg)  07/20/20 242 lb 3.2 oz (109.9 kg)  05/27/20 242 lb (109.8 kg)   Constitutional: overweight, in NAD Eyes: PERRLA, EOMI, no exophthalmos ENT: moist mucous membranes, +  large thyroid nodule palpated in the right thyroid lobe and isthmus, no cervical lymphadenopathy Cardiovascular: RRR, No MRG, + B periankle edema Respiratory: CTA B Gastrointestinal: abdomen soft, NT, ND, BS+ Musculoskeletal: no deformities, strength intact in all 4 Skin: moist, warm, no rashes Neurological: no tremor with outstretched hands, DTR normal in all 4  ASSESSMENT: 1. Thyroid nodule -in inferior right lobe  2.  Subclinical thyrotoxicosis  PLAN: 1. Thyroid nodule -We reviewed together the results of her previous imaging tests and biopsy results.  She had a large dominant nodule which was biopsied in 04/2018 with inconclusive results.  At that time, unfortunately, the results were not sent for Afirma molecular marker.  We checked another thyroid ultrasound and the nodule appeared stable.  An uptake and scan showed that the nodule is actually cold so we discussed about the possible risk of cancer despite the isoechoic appearance, the lack of internal blood flow or other concerning features.  She does not have a family history of thyroid cancer or personal history of radiation therapy to head or neck.  An FNA returned inconclusive, however, the  Afirma molecular marker returned benign and I explained that is associated with a low risk of cancer, of lower than 4%. -She denies neck compression symptoms. -We will continue to follow the nodule clinically and will repeat the ultrasound at next visit.  2.  Graves disease -Manifested as subclinical thyrotoxicosis -She had normal thyroid function tests in 2017 and 2019, however, in 06/2019, TSH was suppressed.  This was confirmed at our visit in 09/2019.  Free T4 and free T3 were normal.  Her TSI antibodies were also elevated.  A thyroid scan was homogeneous except for a cold nodule and also elevated uptake. -We started methimazole 5 mg daily but she stopped this right after a thyroid uptake and scan.  We repeated her TFTs, they were thyrotoxic so we restarted methimazole at the same dose.  She again did not return for labs afterwards. -At  last visit, we discussed about the importance of checking labs regularly especially if the thyroid tests are not in range and we are changing medications.  However, and time, TFTs were normal and we continued the same dose of methimazole, 5 mg daily -At today's visit, she denies thyrotoxic symptoms: Weight loss, heat intolerance, hyper defecation, anxiety.  She continues to have palpitations.  At last visit we reduced her atenolol dose to only once a day.  She was taking it twice a day before last visit. Pulse today is WNL.  At this visit, she tells me that she has an appointment with cardiology in 2 days to go over her event monitor data.  I advised her to discuss with cardiology whether she can stop atenolol completely. -We will recheck her TSH, free T4, free T3 -We again discussed about possible modalities of treatment for Graves' disease to include methimazole, radioactive iodine ablation or, last resort, surgery.  For now, we can continue methimazole but she is open to RAI treatment, if needed. -I will see her back in 6 months  Component     Latest Ref Rng &  Units 09/30/2020  TSH     0.35 - 4.50 uIU/mL 1.24  T4,Free(Direct)     0.60 - 1.60 ng/dL 7.25  Triiodothyronine,Free,Serum     2.3 - 4.2 pg/mL 3.9  Thyroid tests are excellent.  We will continue with the same dose of methimazole.  Carlus Pavlov, MD PhD [facility] Endocrinology

## 2020-09-30 NOTE — Patient Instructions (Addendum)
Please stop at the lab.  Please continue: - Methimazole 5 mg daily  Please check with cardiology if you can stop: - Atenolol 25 mg 1x a day  Please come back for a follow-up appointment in 6 months.

## 2020-10-02 ENCOUNTER — Encounter: Payer: Self-pay | Admitting: Cardiology

## 2020-10-02 ENCOUNTER — Telehealth (INDEPENDENT_AMBULATORY_CARE_PROVIDER_SITE_OTHER): Payer: Managed Care, Other (non HMO) | Admitting: Cardiology

## 2020-10-02 VITALS — BP 162/96 | HR 96 | Temp 98.2°F | Ht 64.0 in | Wt 240.0 lb

## 2020-10-02 DIAGNOSIS — R072 Precordial pain: Secondary | ICD-10-CM

## 2020-10-02 DIAGNOSIS — R030 Elevated blood-pressure reading, without diagnosis of hypertension: Secondary | ICD-10-CM

## 2020-10-02 DIAGNOSIS — E0500 Thyrotoxicosis with diffuse goiter without thyrotoxic crisis or storm: Secondary | ICD-10-CM | POA: Diagnosis not present

## 2020-10-02 DIAGNOSIS — R002 Palpitations: Secondary | ICD-10-CM | POA: Diagnosis not present

## 2020-10-02 DIAGNOSIS — M25473 Effusion, unspecified ankle: Secondary | ICD-10-CM

## 2020-10-02 MED ORDER — ATENOLOL 25 MG PO TABS: 25.0000 mg | ORAL_TABLET | ORAL | 3 refills | Status: DC | PRN

## 2020-10-02 NOTE — Assessment & Plan Note (Signed)
Mild MD swelling.  Probably related to venous stasis.  She is on her feet for long period of time.  Talked about wearing support stockings and elevating feet when possible.

## 2020-10-02 NOTE — Assessment & Plan Note (Signed)
Seen by endocrinology.  They are suggesting that thyroid function screen stabilized.  I suspect we can probably wean her off atenolol as suggested by Dr. Elvera Lennox.

## 2020-10-02 NOTE — Assessment & Plan Note (Signed)
Stable blood pressures now.  I think it that I asked her thyroid function stabilizes, she can easily wean off atenolol.  We can to simply follow her pressures afterwards.

## 2020-10-02 NOTE — Patient Instructions (Addendum)
Medication Instructions:  It sounds like your thyroid levels have improved.  Since your blood pressure and heart rate look good and the palpitations are also better I think is reasonable to try to wean off the atenolol. ->  Obviously, if the palpitations return, go back to the regular dosing.  Recommendation: For the next 2 weeks take atenolol 1/2 tablet daily, then take 1/2 tablet every other day for a week before stopping -> In the future, you can use either one half or a full tablet of atenolol if you have bad spells of palpitation.  *If you need a refill on your cardiac medications before your next appointment, please call your pharmacy*  Follow-Up: At [facility], you and your health needs are our priority.  As part of our continuing mission to provide you with exceptional heart care, we have created designated Provider Care Teams.  These Care Teams include your primary Cardiologist (physician) and Advanced Practice Providers (APPs -  Physician Assistants and Nurse Practitioners) who all work together to provide you with the care you need, when you need it.  We recommend signing up for the patient portal called "MyChart".  Sign up information is provided on this After Visit Summary.  MyChart is used to connect with patients for Virtual Visits (Telemedicine).  Patients are able to view lab/test results, encounter notes, upcoming appointments, etc.  Non-urgent messages can be sent to your provider as well.   To learn more about what you can do with MyChart, go to [email or web address].    Your next appointment:   6 month(s)  The format for your next appointment:   Either virtual or in person  Provider:   Bryan Lemma, MD   Other Instructions Continue to exercise.

## 2020-10-02 NOTE — Progress Notes (Signed)
Virtual Visit via Telephone Note   This visit type was conducted due to national recommendations for restrictions regarding the COVID-19 Pandemic (e.g. social distancing) in an effort to limit this patient's exposure and mitigate transmission in our community.  Due to her co-morbid illnesses, this patient is at least at moderate risk for complications without adequate follow up.  This format is felt to be most appropriate for this patient at this time.  The patient did not have access to video technology/had technical difficulties with video requiring transitioning to audio format only (telephone).  All issues noted in this document were discussed and addressed.  No physical exam could be performed with this format.  Please refer to the patient's chart for her  consent to telehealth for [facility].   Patient has given verbal permission to conduct this visit via virtual appointment and to bill insurance 10/02/2020 1:34 PM     Evaluation Performed:  Follow-up visit  Date:  10/02/2020   ID:  Tonya Stokes, DOB 07-24-1976, MRN [id number]  Patient Location: Home Provider Location: Office/Clinic  PCP:  Verlon Au, MD  Cardiologist:  Bryan Lemma, MD  Electrophysiologist:  None  Endocrinologist: Ernest Haber, MD  Chief Complaint:   Chief Complaint  Patient presents with  . Follow-up    Monitor results.   . Palpitations    Better, less frequent    History of Present Illness:    Tonya Stokes is a 44 y.o. female with PMH notable for CLASS III OBESITY and PREDIABETES who presents via audio/video conferencing for a telehealth visit today as a 7-month follow-up for evaluation of PALPITATIONS and SWELLING.   Problem List Items Addressed This Visit    Elevated blood pressure reading without diagnosis of hypertension (Chronic)   Graves disease (Chronic)   Rapid palpitations   Ankle swelling - Primary   Tonya Stokes was last seen on July 20, 2020 at the  request of [name], MD for evaluation of tachycardia/palpitations.  She had been diagnosed with Graves' disease/hyperthyroidism and is being treated by Dr. Elvera Lennox having started methimazole and atenolol.  She also noted bilateral ankle edema.  Usually when she is on her feet all night long.  (Works night shift).  Was noting palpitations maybe for 5 times a day lasting up to 15 seconds to a minute.  Mild dizziness with longer spells. -> 3-day monitor ordered.  Hospitalizations:  . None  She was recently seen by Dr. Elvera Lennox from endocrinology   Recent - Interim CV studies:   The following studies were reviewed today: . 3-day Zio patch monitor: Sinus rhythm with rates from 58 to 116 bpm.  Average heart rate 80 bpm.  Isolated PVCs and PACs (less than 1%).  No couplets or triplets.  No bigeminy or trigeminy. -->  Essentially normal  Inerval History   Tonya Stokes is doing quite well now.  She was just seen by Dr. Elvera Lennox from endocrinology and was told that her thyroid levels looking good.  Her blood pressure and heart rates were also looking good.  They were suggesting possibly weaning off the atenolol.  She really has not been having evidence of any palpitations just rare fleeting episodes maybe once every day or every other day.  They do not last very long only few seconds.  No prolonged spells.  In fact when she was wearing the monitor she did have any prolonged spells.  She has been try to walk about 20 minutes a day and is doing quite well  with that.  Also her mild swelling has also improved with elevating her feet and support socks.  She denies any active cardiac symptoms of chest pain pressure or dyspnea with rest or exertion.  No PND, orthopnea with improved edema.  No syncope or near syncope, TIA or amaurosis fugax.  No claudication.  ROS:  Please see the history of present illness.    The patient does not have symptoms concerning for COVID-19 infection (fever, chills, cough, or new  shortness of breath).   The patient is practicing social distancing.  Past Medical History:  Diagnosis Date  . Graves disease    On atenolol and methimazole  . History of cardiac murmur    Per chart review -> 2D echo in April 2017 suggested increased velocities across aortic valve.  No valvular or subvalvular stenosis.  . Obesity, Class III, BMI 40-49.9 (morbid obesity) (HCC)    BMI 40.3  . Prediabetes   . Sickle cell trait (HCC)    Per chart review   Past Surgical History:  Procedure Laterality Date  . CESAREAN SECTION  09/02/2007  . CESAREAN SECTION  02/27/2011  . GXT/ETT  04/2016    Exercised 7 min = 8.5 METS.  Peak HR 166 bpm equals 92% MPHR (179 bpm).  Mild hypertensive response.  Upsloping ST segments-nondiagnostic.  NEGATIVE/ADEQUATE ETT  . TRANSTHORACIC ECHOCARDIOGRAM  03/2016   Technically difficult study.  EF 60 to 65%.  Normal wall motion and diastolic parameters.  Aortic valve not well visualized minimally increased velocity across the valve.     Current Meds  Medication Sig  . atenolol (TENORMIN) 25 MG tablet Take 1 tablet (25 mg total) by mouth as needed (palpitations).  . methimazole (TAPAZOLE) 5 MG tablet Take 1 tablet (5 mg total) by mouth daily. With a meal.  . [DISCONTINUED] atenolol (TENORMIN) 25 MG tablet Take 1 tablet (25 mg total) by mouth daily.     Allergies:   Patient has no known allergies.   Social History   Tobacco Use  . Smoking status: Never Smoker  . Smokeless tobacco: Never Used  Vaping Use  . Vaping Use: Never used  Substance Use Topics  . Alcohol use: No  . Drug use: No     Family Hx: The patient's family history includes Irregular heart beat in her mother; Other in her father. There is no history of Breast cancer.   Labs/Other Tests and Data Reviewed:    EKG:  EKG not checked  Recent Labs: 09/30/2020: TSH 1.24   Recent Lipid Panel n/a  Wt Readings from Last 3 Encounters:  10/02/20 240 lb (108.9 kg)  09/30/20 243 lb 4.8  oz (110.4 kg)  07/20/20 242 lb 3.2 oz (109.9 kg)     Objective:     Vital Signs: BP 124/80; pulse 79 BP (!) 162/96   Pulse 96   Temp 98.2 F (36.8 C)   Ht 5\' 4"  (1.626 m)   Wt 240 lb (108.9 kg)   SpO2 97%   BMI 41.20 kg/m   VITAL SIGNS:  reviewed Pleasant female in NO acute distress. A&O x 3.  normal Mood & Affect Non-labored respirations No swelling  ASSESSMENT & PLAN:    Problem List Items Addressed This Visit    Elevated blood pressure reading without diagnosis of hypertension (Chronic)    Stable blood pressures now.  I think it that I asked her thyroid function stabilizes, she can easily wean off atenolol.  We can to simply follow her pressures  afterwards.      Graves disease (Chronic)    Seen by endocrinology.  They are suggesting that thyroid function screen stabilized.  I suspect we can probably wean her off atenolol as suggested by Dr. Elvera LennoxGherghe.      Relevant Medications   atenolol (TENORMIN) 25 MG tablet   Chest pain    No longer really having chest pain is more discomfort with palpitations.      Rapid palpitations    Continue to stress importance of staying hydrated.  I suspect that her tachycardia is improved as her thyroid levels improved.  Okay to try to wean off atenolol over the next couple weeks as indicated below.      Ankle swelling - Primary    Mild MD swelling.  Probably related to venous stasis.  She is on her feet for long period of time.  Talked about wearing support stockings and elevating feet when possible.        Plan: Blood pressures well controlled as is the palpitations now that her thyroid levels have been controlled.  I do think is reasonable for her to wean off of the atenolol provided the palpitations and her thyroid levels stay stable. --> Instructions for weaning off atenolol are to reduce to 1/2 tablet daily for 2 weeks and then 1/2 tablet every other day for a week before stopping.  Okay to restart or se PRN for recurrent  palpitations.   Continue to recommend foot elevation and support stockings for edema.   ------------------------------------- COVID-19 Education: The signs and symptoms of COVID-19 were discussed with the patient and how to seek care for testing (follow up with PCP or arrange E-visit).   The importance of social distancing was discussed today.  Time:   Today, I have spent 9 minutes with the patient with telehealth technology discussing the above problems.  5 minutes charting.  14 minutes total   Medication Adjustments/Labs and Tests Ordered: Current medicines are reviewed at length with the patient today.  Concerns regarding medicines are outlined above.   Patient Instructions  Medication Instructions:  It sounds like your thyroid levels have improved.  Since your blood pressure and heart rate look good and the palpitations are also better I think is reasonable to try to wean off the atenolol. ->  Obviously, if the palpitations return, go back to the regular dosing.  Recommendation: For the next 2 weeks take atenolol 1/2 tablet daily, then take 1/2 tablet every other day for a week before stopping -> In the future, you can use either one half or a full tablet of atenolol if you have bad spells of palpitation.  *If you need a refill on your cardiac medications before your next appointment, please call your pharmacy*  Follow-Up: At [facility], you and your health needs are our priority.  As part of our continuing mission to provide you with exceptional heart care, we have created designated Provider Care Teams.  These Care Teams include your primary Cardiologist (physician) and Advanced Practice Providers (APPs -  Physician Assistants and Nurse Practitioners) who all work together to provide you with the care you need, when you need it.  We recommend signing up for the patient portal called "MyChart".  Sign up information is provided on this After Visit Summary.  MyChart is used to  connect with patients for Virtual Visits (Telemedicine).  Patients are able to view lab/test results, encounter notes, upcoming appointments, etc.  Non-urgent messages can be sent to your provider as  well.   To learn more about what you can do with MyChart, go to [email or web address].    Your next appointment:   6 month(s)  The format for your next appointment:   Either virtual or in person  Provider:   Bryan Lemma, MD   Other Instructions Continue to exercise.    Signed, Bryan Lemma, MD  10/02/2020 1:34 PM    [facility]

## 2020-10-02 NOTE — Assessment & Plan Note (Signed)
Continue to stress importance of staying hydrated.  I suspect that her tachycardia is improved as her thyroid levels improved.  Okay to try to wean off atenolol over the next couple weeks as indicated below.

## 2020-10-02 NOTE — Assessment & Plan Note (Signed)
No longer really having chest pain is more discomfort with palpitations.

## 2020-10-12 ENCOUNTER — Other Ambulatory Visit: Payer: Self-pay | Admitting: Family Medicine

## 2020-10-12 DIAGNOSIS — Z1231 Encounter for screening mammogram for malignant neoplasm of breast: Secondary | ICD-10-CM

## 2020-11-23 ENCOUNTER — Ambulatory Visit
Admission: RE | Admit: 2020-11-23 | Discharge: 2020-11-23 | Disposition: A | Discharge status: Home / Self Care | Payer: Managed Care, Other (non HMO) | Source: Ambulatory Visit | Attending: Family Medicine | Admitting: Family Medicine

## 2020-11-23 ENCOUNTER — Other Ambulatory Visit: Payer: Self-pay

## 2020-11-23 DIAGNOSIS — Z1231 Encounter for screening mammogram for malignant neoplasm of breast: Secondary | ICD-10-CM

## 2021-03-29 ENCOUNTER — Ambulatory Visit (INDEPENDENT_AMBULATORY_CARE_PROVIDER_SITE_OTHER): Payer: Self-pay | Admitting: Internal Medicine

## 2021-03-29 ENCOUNTER — Encounter: Payer: Self-pay | Admitting: Internal Medicine

## 2021-03-29 ENCOUNTER — Other Ambulatory Visit: Payer: Self-pay

## 2021-03-29 VITALS — BP 128/90 | HR 75 | Ht 64.0 in | Wt 242.8 lb

## 2021-03-29 DIAGNOSIS — E0500 Thyrotoxicosis with diffuse goiter without thyrotoxic crisis or storm: Secondary | ICD-10-CM

## 2021-03-29 DIAGNOSIS — E041 Nontoxic single thyroid nodule: Secondary | ICD-10-CM

## 2021-03-29 LAB — TSH: TSH: 1.21 u[IU]/mL (ref 0.35–4.50)

## 2021-03-29 LAB — T3, FREE: T3, Free: 3.6 pg/mL (ref 2.3–4.2)

## 2021-03-29 LAB — T4, FREE: Free T4: 0.64 ng/dL (ref 0.60–1.60)

## 2021-03-29 NOTE — Patient Instructions (Addendum)
Please stop at the lab.  Please continue to stay off Methimazole for now.  Please come back for a follow-up appointment in 6 months.

## 2021-03-29 NOTE — Progress Notes (Signed)
Patient ID: Tonya Stokes, female   DOB: 27-Aug-1976, 45 y.o.   MRN: [id number]   This visit occurred during the SARS-CoV-2 public health emergency.  Safety protocols were in place, including screening questions prior to the visit, additional usage of staff PPE, and extensive cleaning of exam room while observing appropriate contact time as indicated for disinfecting solutions.   HPI  Tonya Stokes is a 45 y.o.-year-old female, [place], initially referred by her PCP, Dr. Leavy CellaBoyd, returning for follow-up for a right thyroid nodule and subclinical thyrotoxicosis/Graves' disease.  Last visit 6 months ago.  Interim history: She tells me she saw cardiology in 09/2020 and they advised her to taper off Atenolol but she misunderstood instructions and she also stopped  Methimazole!!!! No palpitations, no tremors, but has LE edema.  Right thyroid nodule  -Palpated by PCP in 2019 during APE.  She did not have neck compression symptoms at that time.  Reviewed her thyroid nodule investigation:  Thyroid U/S (02/15/2018): Mildly heterogeneous thyroid, with right inferior 2.3 x 1.3 x 2 cm solid/almost completely solid, isoechoic nodule, with punctate echogenic foci - ACR TI-RADS score: 6 - FNA recommended.  Thyroid U/S (03/13/2018): Parenchymal Echotexture:  Mildly heterogenous Isthmus: 4 mm Right lobe: 6.6 x 2.0 x 2.6 cm Left lobe: 4.5 x 1.9 x 2.3 cm ___________________________________________  Nodule # 1: Location: Right; Inferior Maximum size: 2.4, previously 2.3 cm; Other 2 dimensions: 1.7 x 1.6, previously 1.3 x 2.0 cm Composition: solid/almost completely solid (2) Echogenicity: isoechoic (1) Shape: not taller-than-wide (0) Margins: ill-defined (0) Echogenic foci: punctate echogenic foci (3) ACR TI-RADS total points: 6.  **Given size (>/= 1.5 cm) and appearance, fine needle aspiration of this moderately suspicious nodule should be considered based on TI-RADS  criteria. _________________________________________________________  The isthmus and left inferior lobe also demonstrate subcentimeter hypoechoic and isoechoic nodules, all measuring 9 mm or less. These would not meet criteria for any biopsy or follow-up. Normal vascularity. Mild nonspecific thyroid heterogeneity. No adenopathy.  IMPRESSION: 2.4 cm right inferior TR 4 nodule meets criteria for biopsy as above.    FNA of this nodule (04/11/2018): AUS/FLUS, but the sample was not sent for Afirma molecular marker  Thyroid uptake and scan (10/16/2019):  Cold nodule at inferior pole of RIGHT thyroid lobe, corresponding to nodule seen on ultrasound. Uptake of tracer within remaining thyroid tissue is homogeneous. No additional areas of increased or decreased tracer localization seen.  4 hour I-123 uptake = 19.4% (normal 5-20%) 24 hour I-123 uptake = 41.3% (normal 10-30%)  IMPRESSION: Cold nodule at inferior pole of RIGHT thyroid lobe corresponding to nodule identified by prior ultrasound and previously biopsied in 2019, recommend correlation with pathology.  Elevated 24 hour radio iodine uptake consistent with hyperthyroidism.  Thyroid U/S (11/18/2019): Parenchymal Echotexture: Moderately heterogenous Isthmus: 0.4 cm Right lobe: 6.1 x 2.5 x 2.2 cm Left lobe: 5.4 x 1.9 x 2.3 cm ____________________________________________________  Nodule # 2: The previously biopsied nodule in the right inferior gland is essentially unchanged at 2.3 x 1.7 x 1.6 cm compared to 2.4 x 1.7 x 1.6 cm previously.  Nodule # 1: A small isoechoic solid nodule in the thyroid isthmus measures up to 1.3 cm. This nodule a does not meet criteria for biopsy or imaging surveillance.  IMPRESSION: No significant interval change in the size or appearance of the previously biopsied 2.3 cm nodule in the right inferior thyroid gland.  FNA (01/30/2020):  Clinical History: Nodule #2: The previously biopsied  nodule in the right  inferio gland is essentially unchanged at 2.3 x 1.7 x 1.6cm compared to  2.4 x 1.7 x 1.6cm previously, 04/21/18 [facility]  Specimen Submitted: A. THYROID, RLP, FINE NEEDLE ASPIRATION:   FINAL MICROSCOPIC DIAGNOSIS:  - Follicular lesion of undetermined significance ([facility] category III)   SPECIMEN ADEQUACY:  Satisfactory for evaluation   AFIRMA: Benign (risk of malignancy up to 4%).  Graves' disease: - dx'ed in 2020  Reviewed her TFTs: Lab Results  Component Value Date   TSH 1.24 09/30/2020   TSH 1.17 05/27/2020   TSH <0.01 (L) 01/22/2020   TSH <0.01 (L) 09/20/2019   TSH 4.06 03/25/2016   FREET4 0.71 09/30/2020   FREET4 0.71 05/27/2020   FREET4 1.18 01/22/2020   FREET4 1.14 09/20/2019  06/06/2019: TSH 0.040, free T4 1.1 (0.6-1.4), total T3 148 (62-194) 02/07/2018: TSH 5.09 (0.45-5.33)  Graves' antibodies were elevated: Lab Results  Component Value Date   TSI 205 (H) 09/20/2019   We started methimazole 5 mg daily in 09/2019.  She came off the medication as she misunderstood instructions after her RAI treatment, but we restarted it afterwards.  As mentioned above, she again stopped methimazole as she again misunderstood instructions in 09/2020...  Pt denies: - feeling nodules in neck - hoarseness - dysphagia - choking - SOB with lying down  No FH of thyroid disease or cancer. No h/o radiation tx to head or neck.  No seaweed or kelp. No recent contrast studies. No herbal supplements. No Biotin use. No recent steroids use.   She saw cardiology >> was on a heart monitor.  ROS: Constitutional: no weight gain/no weight loss, no fatigue, no subjective hyperthermia, no subjective hypothermia Eyes: no blurry vision, no xerophthalmia ENT: no sore throat, + see HPI Cardiovascular: no CP/no SOB/no palpitations/+ B leg swelling Respiratory: no cough/no SOB/no wheezing Gastrointestinal: no N/no V/no D/no C/no acid reflux Musculoskeletal: no muscle  aches/no joint aches Skin: no rashes, no hair loss Neurological: no tremors/no numbness/no tingling/no dizziness  I reviewed pt's medications, allergies, PMH, social hx, family hx, and changes were documented in the history of present illness. Otherwise, unchanged from my initial visit note.  Past Medical History:  Diagnosis Date  . Graves disease    On atenolol and methimazole  . History of cardiac murmur    Per chart review -> 2D echo in April 2017 suggested increased velocities across aortic valve.  No valvular or subvalvular stenosis.  . Obesity, Class III, BMI 40-49.9 (morbid obesity) (HCC)    BMI 40.3  . Prediabetes   . Sickle cell trait (HCC)    Per chart review   Past Surgical History:  Procedure Laterality Date  . CESAREAN SECTION  09/02/2007  . CESAREAN SECTION  02/27/2011  . GXT/ETT  04/2016    Exercised 7 min = 8.5 METS.  Peak HR 166 bpm equals 92% MPHR (179 bpm).  Mild hypertensive response.  Upsloping ST segments-nondiagnostic.  NEGATIVE/ADEQUATE ETT  . TRANSTHORACIC ECHOCARDIOGRAM  03/2016   Technically difficult study.  EF 60 to 65%.  Normal wall motion and diastolic parameters.  Aortic valve not well visualized minimally increased velocity across the valve.   Social History   Socioeconomic History  . Marital status: Married    Spouse name: Not on file  . Number of children: 27-51 and 95 years old in 09/2019  . Years of education: Not on file  . Highest education level: Not on file  Occupational History  . 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 Financial resource strain: Not on file  . Food insecurity    Worry: Not on file    Inability: Not on file  . Transportation needs    Medical: Not on file    Non-medical: Not on file  Tobacco Use  . Smoking status: Never Smoker  . Smokeless tobacco: Never Used  Substance and Sexual Activity  . Alcohol use: No  . Drug use: No  Lifestyle  Relationships  Social History Narrative   epworth  sleepiness scale = 5 (03/11/16)   She takes ibuprofen and allergy medications.  No prescription medications  Current Outpatient Medications on File Prior to Visit  Medication Sig Dispense Refill  . atenolol (TENORMIN) 25 MG tablet Take 1 tablet (25 mg total) by mouth as needed (palpitations). 90 tablet 3  . methimazole (TAPAZOLE) 5 MG tablet Take 1 tablet (5 mg total) by mouth daily. With a meal. 90 tablet 3   No current facility-administered medications on file prior to visit.   No Known Allergies   Family History  Problem Relation Age of Onset  . Irregular heart beat Mother   . Other Father        Unknown, daughter early age  . Breast cancer Neg Hx   + Heart disease and HL in mother.  PE: BP 128/90 (BP Location: Right Arm, Patient Position: Sitting, Cuff Size: Normal)   Pulse 75   Ht 5\' 4"  (1.626 m)   Wt 242 lb 12.8 oz (110.1 kg)   SpO2 96%   BMI 41.68 kg/m  Wt Readings from Last 3 Encounters:  03/29/21 242 lb 12.8 oz (110.1 kg)  10/02/20 240 lb (108.9 kg)  09/30/20 243 lb 4.8 oz (110.4 kg)   Constitutional: overweight, in NAD Eyes: PERRLA, EOMI, no exophthalmos ENT: moist mucous membranes, +  large thyroid nodule palpated in the right thyroid lobe and isthmus, no cervical lymphadenopathy Cardiovascular: RRR, No MRG, + B periankle edema, nonpitting Respiratory: CTA B Gastrointestinal: abdomen soft, NT, ND, BS+ Musculoskeletal: no deformities, strength intact in all 4 Skin: moist, warm, no rashes Neurological: no tremor with outstretched hands, DTR normal in all 4  ASSESSMENT: 1. Thyroid nodule -in inferior right lobe  2.  Subclinical thyrotoxicosis  PLAN: 1. Thyroid nodule -Reviewed together the results of her previous imaging tests and biopsy results.  She had a large dominant nodule which was biopsied in 04/2018 within inconclusive results.  At that time, unfortunately, the results were not sent for the Afirma molecular marker.  We checked another ultrasound and  the nodule appears stable.  Thyroid uptake and scan showed that the nodule was actually cold.  We repeated the FNA which returned inconclusive, however, near for molecular marker returned benign and I explained that this was associated with a low risk for cancer, lower than 4% -At this visit, she denies neck compression symptoms -We will check a thyroid ultrasound after the above results return.  In case she is thyrotoxic, will restore euthyroidism before checking a thyroid ultrasound  2.  Graves disease -Manifested as subclinical thyrotoxicosis -She had normal thyroid function test in 2017 and 2019, however, in 06/24/2019 and 09/24/2019, TSH was suppressed.  Free thyroid hormones were normal.  TSI's were also elevated.  The thyroid scan was homogeneous except for cold nodule and she also had an elevated iodine uptake.  This confirms Graves' disease. -We initially started methimazole 5 mg daily but she stopped this right after the thyroid uptake and scan.  We resumed methimazole  afterwards since her TSH was still low. -At last visit she denies thyrotoxic symptoms.  She was only on atenolol once a day, decreased from twice a day.  I advised her to discuss with cardiology if she can stop atenolol completely.  Her TFTs were normal then.  I advised her to continue methimazole. -However, she had a virtual appointment with cardiology 2 days later and Dr. Herbie Baltimore advised her that she can taper atenolol to off.  Unfortunately, she misunderstood instructions and stopped both methimazole and atenolol. -At today's visit, she again denies thyrotoxic symptoms.  She also does not have tachycardia, tremors, or weight loss. -We will recheck her TFTs and we may need to start methimazole afterwards  -I will have her return in 6 months  Component     Latest Ref Rng & Units 03/29/2021  TSH     0.35 - 4.50 uIU/mL 1.21  T4,Free(Direct)     0.60 - 1.60 ng/dL 4.09  Triiodothyronine,Free,Serum     2.3 - 4.2 pg/mL 3.6   TSI     <140 % baseline 106  Excellent results, so we can stay off methimazole for now!  Carlus Pavlov, MD PhD [facility]

## 2021-03-31 LAB — THYROID STIMULATING IMMUNOGLOBULIN: TSI: 106 % baseline (ref <140)

## 2021-09-28 ENCOUNTER — Other Ambulatory Visit: Payer: Self-pay

## 2021-09-28 ENCOUNTER — Ambulatory Visit (INDEPENDENT_AMBULATORY_CARE_PROVIDER_SITE_OTHER): Payer: Self-pay | Admitting: Internal Medicine

## 2021-09-28 ENCOUNTER — Encounter: Payer: Self-pay | Admitting: Internal Medicine

## 2021-09-28 VITALS — BP 130/78 | HR 87 | Ht 64.0 in | Wt 242.2 lb

## 2021-09-28 DIAGNOSIS — E0500 Thyrotoxicosis with diffuse goiter without thyrotoxic crisis or storm: Secondary | ICD-10-CM

## 2021-09-28 DIAGNOSIS — E041 Nontoxic single thyroid nodule: Secondary | ICD-10-CM

## 2021-09-28 LAB — TSH: TSH: 1.71 u[IU]/mL (ref 0.35–5.50)

## 2021-09-28 LAB — T3, FREE: T3, Free: 3.1 pg/mL (ref 2.3–4.2)

## 2021-09-28 LAB — T4, FREE: Free T4: 0.67 ng/dL (ref 0.60–1.60)

## 2021-09-28 NOTE — Progress Notes (Addendum)
Patient ID: Tonya Stokes, female   DOB: 06-Jan-1976, 45 y.o.   MRN: [id number]   This visit occurred during the SARS-CoV-2 public health emergency.  Safety protocols were in place, including screening questions prior to the visit, additional usage of staff PPE, and extensive cleaning of exam room while observing appropriate contact time as indicated for disinfecting solutions.   HPI  Tonya Stokes is a 45 y.o.-year-old female, originally from [facility], initially referred by her PCP, Dr. Leavy Cella, returning for follow-up for a right thyroid nodule and subclinical thyrotoxicosis/Graves' disease.  Last visit 6 months ago.  Interim history: At this visit, she denies tremors, palpitations, unintentional weight loss, hot flashes.   LE edema resolved since last visit.  Right thyroid nodule  -Palpated by PCP in 2019 during APE.  She did not have neck compression symptoms at that time.  Reviewed her thyroid nodule investigation:  Thyroid U/S (02/15/2018): Mildly heterogeneous thyroid, with right inferior 2.3 x 1.3 x 2 cm solid/almost completely solid, isoechoic nodule, with punctate echogenic foci - ACR TI-RADS score: 6 - FNA recommended.  Thyroid U/S (03/13/2018): Parenchymal Echotexture:  Mildly heterogenous Isthmus: 4 mm Right lobe: 6.6 x 2.0 x 2.6 cm Left lobe: 4.5 x 1.9 x 2.3 cm ___________________________________________   Nodule # 1: Location: Right; Inferior Maximum size: 2.4, previously 2.3 cm; Other 2 dimensions: 1.7 x 1.6, previously 1.3 x 2.0 cm Composition: solid/almost completely solid (2) Echogenicity: isoechoic (1) Shape: not taller-than-wide (0) Margins: ill-defined (0) Echogenic foci: punctate echogenic foci (3) ACR TI-RADS total points: 6.   **Given size (>/= 1.5 cm) and appearance, fine needle aspiration of this moderately suspicious nodule should be considered based on TI-RADS criteria. _________________________________________________________   The isthmus and  left inferior lobe also demonstrate subcentimeter hypoechoic and isoechoic nodules, all measuring 9 mm or less. These would not meet criteria for any biopsy or follow-up. Normal vascularity. Mild nonspecific thyroid heterogeneity. No adenopathy.   IMPRESSION: 2.4 cm right inferior TR 4 nodule meets criteria for biopsy as above.    FNA of this nodule (04/11/2018): AUS/FLUS, but the sample was not sent for Afirma molecular marker  Thyroid uptake and scan (10/16/2019):  Cold nodule at inferior pole of RIGHT thyroid lobe, corresponding to nodule seen on ultrasound. Uptake of tracer within remaining thyroid tissue is homogeneous. No additional areas of increased or decreased tracer localization seen.  4 hour I-123 uptake = 19.4% (normal 5-20%) 24 hour I-123 uptake = 41.3% (normal 10-30%)  IMPRESSION: Cold nodule at inferior pole of RIGHT thyroid lobe corresponding to nodule identified by prior ultrasound and previously biopsied in 2019, recommend correlation with pathology.  Elevated 24 hour radio iodine uptake consistent with hyperthyroidism.  Thyroid U/S (11/18/2019): Parenchymal Echotexture: Moderately heterogenous Isthmus: 0.4 cm Right lobe: 6.1 x 2.5 x 2.2 cm Left lobe: 5.4 x 1.9 x 2.3 cm ____________________________________________________   Nodule # 2: The previously biopsied nodule in the right inferior gland is essentially unchanged at 2.3 x 1.7 x 1.6 cm compared to 2.4 x 1.7 x 1.6 cm previously.   Nodule # 1: A small isoechoic solid nodule in the thyroid isthmus measures up to 1.3 cm. This nodule a does not meet criteria for biopsy or imaging surveillance.   IMPRESSION: No significant interval change in the size or appearance of the previously biopsied 2.3 cm nodule in the right inferior thyroid gland.  FNA (01/30/2020):  Clinical History: Nodule #2: The previously biopsied nodule in the right  Inferior gland is essentially unchanged at 2.3  x 1.7 x 1.6cm compared  to  2.4 x 1.7 x 1.6cm previously, 04/21/18 [facility]  Specimen Submitted:  A. THYROID, RLP, FINE NEEDLE ASPIRATION:   FINAL MICROSCOPIC DIAGNOSIS:  - Follicular lesion of undetermined significance ([facility] category III)   SPECIMEN ADEQUACY:  Satisfactory for evaluation   AFIRMA: Benign (risk of malignancy up to 4%).  Graves' disease: - dx'ed in 2020  Reviewed her TFTs: Lab Results  Component Value Date   TSH 1.21 03/29/2021   TSH 1.24 09/30/2020   TSH 1.17 05/27/2020   TSH <0.01 (L) 01/22/2020   TSH <0.01 (L) 09/20/2019   TSH 4.06 03/25/2016   FREET4 0.64 03/29/2021   FREET4 0.71 09/30/2020   FREET4 0.71 05/27/2020   FREET4 1.18 01/22/2020   FREET4 1.14 09/20/2019  06/06/2019: TSH 0.040, free T4 1.1 (0.6-1.4), total T3 148 (62-194) 02/07/2018: TSH 5.09 (0.45-5.33)  Graves' antibodies were elevated: Lab Results  Component Value Date   TSI 106 03/29/2021   TSI 205 (H) 09/20/2019   We started methimazole 5 mg daily in 09/2019.  She was repeatedly off methimazole afterwards as she continues to misunderstand instructions but at last visit, in 03/2021, we continued off methimazole since TFTs and TSI's were normal.  Pt denies: - feeling nodules in neck - hoarseness - dysphagia - choking - SOB with lying down  No FH of thyroid disease or cancer. No h/o radiation tx to head or neck.  No seaweed or kelp. No recent contrast studies. No herbal supplements. No Biotin use. No recent steroids use.   She saw cardiology >> was on a heart monitor.  ROS: + see HPI  I reviewed pt's medications, allergies, PMH, social hx, family hx, and changes were documented in the history of present illness. Otherwise, unchanged from my initial visit note.  Past Medical History:  Diagnosis Date   Graves disease    On atenolol and methimazole   History of cardiac murmur    Per chart review -> 2D echo in April 2017 suggested increased velocities across aortic valve.  No valvular or subvalvular  stenosis.   Obesity, Class III, BMI 40-49.9 (morbid obesity) (HCC)    BMI 40.3   Prediabetes    Sickle cell trait [facility])    Per chart review   Past Surgical History:  Procedure Laterality Date   CESAREAN SECTION  09/02/2007   CESAREAN SECTION  02/27/2011   GXT/ETT  04/2016    Exercised 7 min = 8.5 METS.  Peak HR 166 bpm equals 92% MPHR (179 bpm).  Mild hypertensive response.  Upsloping ST segments-nondiagnostic.  NEGATIVE/ADEQUATE ETT   TRANSTHORACIC ECHOCARDIOGRAM  03/2016   Technically difficult study.  EF 60 to 65%.  Normal wall motion and diastolic parameters.  Aortic valve not well visualized minimally increased velocity across the valve.   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  No prescription medications  No current outpatient medications on file prior to visit.   No current facility-administered medications on file prior to visit.   No Known Allergies   Family History  Problem Relation Age of Onset   Irregular heart beat Mother    Other Father        Unknown, daughter early age   Breast cancer Neg Hx   + Heart disease and HL in mother.  PE: BP 130/78 (BP Location: Left Arm, Patient Position: Sitting, Cuff Size: Normal)   Pulse 87   Ht 5\' 4"   (1.626 m)   Wt 242 lb 3.2 oz (109.9 kg)   SpO2 96%   BMI 41.57 kg/m  Wt Readings from Last 3 Encounters:  09/28/21 242 lb 3.2 oz (109.9 kg)  03/29/21 242 lb 12.8 oz (110.1 kg)  10/02/20 240 lb (108.9 kg)   Constitutional: overweight, in NAD Eyes: PERRLA, EOMI, no exophthalmos ENT: moist mucous membranes, +  large thyroid nodule palpated in the right thyroid lobe and isthmus, no cervical lymphadenopathy Cardiovascular: RRR, No MRG, no LE edema Respiratory: CTA B Gastrointestinal: abdomen soft, NT, ND, BS+ Musculoskeletal: no deformities, strength intact in all 4 Skin: moist, warm, no rashes Neurological: no tremor with outstretched hands, DTR normal in all 4  ASSESSMENT: 1. Thyroid nodule -in inferior right lobe  2.  Subclinical thyrotoxicosis  PLAN: 1. Thyroid nodule -She had a large dominant nodule which was biopsied in 04/2018 within inconclusive results.  At that time, unfortunately, the results were not sent for the Afirma molecular marker.  We checked another ultrasound and the nodule appears to be stable.  Thyroid uptake and scan showed that the nodule was cold.  We repeated the FNA, which returned inconclusive, however, Afirma molecular marker returned benign and I explained that this was associated with a low risk for cancer, lower than 4%. -No neck compression symptoms -We will check a thyroid ultrasound now  2.  Graves disease -Manifesting with subclinical thyrotoxicosis + see HPI -She had normal thyroid function tests in 2017 and 2019 but the TSH became suppressed in 2020.  Free thyroid hormones were normal.  TSI's were elevated.  A thyroid scan was homogeneous except for a cold nodule and she also had an elevated iodine uptake, confirming Graves' disease.  We initially started methimazole, 5 mg daily, but she stopped this right after the thyroid uptake and scan.  Since the TSH remains low, we resumed methimazole. -Before last visit, she had a virtual appointment with  cardiology and Dr. 2021 advised her that she could taper her atenolol to off.  She misunderstood instructions and stopped both methimazole and atenolol. -Fortunately, at last visit, her TFTs and TSI's were normal so we continued off methimazole -At this visit, she continues to deny thyrotoxic symptoms including tachycardia, tremors, weight loss.  Pulse is normal today. -We will recheck her TFTs today and see if we need to restart methimazole -I will have her return in 6 months  Component     Latest Ref Rng & Units 09/28/2021  TSH     0.35 - 5.50 uIU/mL 1.71  T4,Free(Direct)     0.60 - 1.60 ng/dL 09/30/2021  Triiodothyronine,Free,Serum     2.3 - 4.2 pg/mL 3.1  TFTs  are normal.  Thyroid U/S (10/06/2021):  Parenchymal Echotexture: Moderately heterogenous  Isthmus: 0.9 cm, previous 0.4 cm  Right lobe: 0.5 x 2.2 x 2.5 cm, previously 6.1 x 2.5 x 2.2 cm  Left lobe: 5.5 x 1.8 x 2.0 cm, previously 5.4 x 1.9 x 2.3 cm  _________________________________________________________   Interval decreased size of previously visualized ill-defined but benign-appearing isoechoic solid nodule in the thyroid isthmus (labeled 1, 0.9 cm, previously 1.3 cm).   Similar appearance of previously biopsied isoechoic solid nodule in the right inferior thyroid (labeled 2, 2.2 cm, previously 2.3 cm).   Unchanged benign-appearing isoechoic solid nodule in the left inferior thyroid (labeled 3, 0.8 cm, previously 0.9 cm).   No new discrete thyroid nodules.   No cervical lymphadenopathy.   IMPRESSION: 1. Similar appearing multinodular goiter. 2. Unchanged appearance of previously biopsied right inferior solid thyroid nodule (labeled 2, 2.2 cm, previously 2.3 cm). Recommend correlation with prior biopsy results.   Carlus Pavlov, MD PhD [facility] Endocrinology

## 2021-09-28 NOTE — Patient Instructions (Addendum)
Please stop at the lab.  Please continue to stay off Methimazole for now.  We will need another thyroid U/S.  Please come back for a follow-up appointment in 1 year.

## 2021-10-07 ENCOUNTER — Ambulatory Visit
Admission: RE | Admit: 2021-10-07 | Discharge: 2021-10-07 | Disposition: A | Discharge status: Home / Self Care | Payer: Self-pay | Source: Ambulatory Visit | Attending: Internal Medicine | Admitting: Internal Medicine

## 2021-10-07 DIAGNOSIS — E041 Nontoxic single thyroid nodule: Secondary | ICD-10-CM

## 2021-10-20 ENCOUNTER — Other Ambulatory Visit: Payer: Self-pay | Admitting: Family Medicine

## 2021-12-28 ENCOUNTER — Other Ambulatory Visit: Payer: Self-pay | Admitting: Family Medicine

## 2021-12-28 DIAGNOSIS — Z1231 Encounter for screening mammogram for malignant neoplasm of breast: Secondary | ICD-10-CM

## 2022-01-13 ENCOUNTER — Ambulatory Visit
Admission: RE | Admit: 2022-01-13 | Discharge: 2022-01-13 | Disposition: A | Discharge status: Home / Self Care | Payer: Managed Care, Other (non HMO) | Source: Ambulatory Visit | Attending: Family Medicine | Admitting: Family Medicine

## 2022-01-13 DIAGNOSIS — Z1231 Encounter for screening mammogram for malignant neoplasm of breast: Secondary | ICD-10-CM

## 2022-06-24 IMAGING — MG MM DIGITAL SCREENING BILAT W/ TOMO AND CAD
8 series · 8 of 24 positions shown · non-contrast
Comparison: Previous exam(s).

CLINICAL DATA: Screening.

EXAM:
DIGITAL SCREENING BILATERAL MAMMOGRAM WITH TOMOSYNTHESIS AND CAD
TECHNIQUE: Bilateral screening digital craniocaudal and mediolateral oblique
mammograms were obtained. Bilateral screening digital breast
tomosynthesis was performed. The images were evaluated with
computer-aided detection.

[R MLO synth-2D]
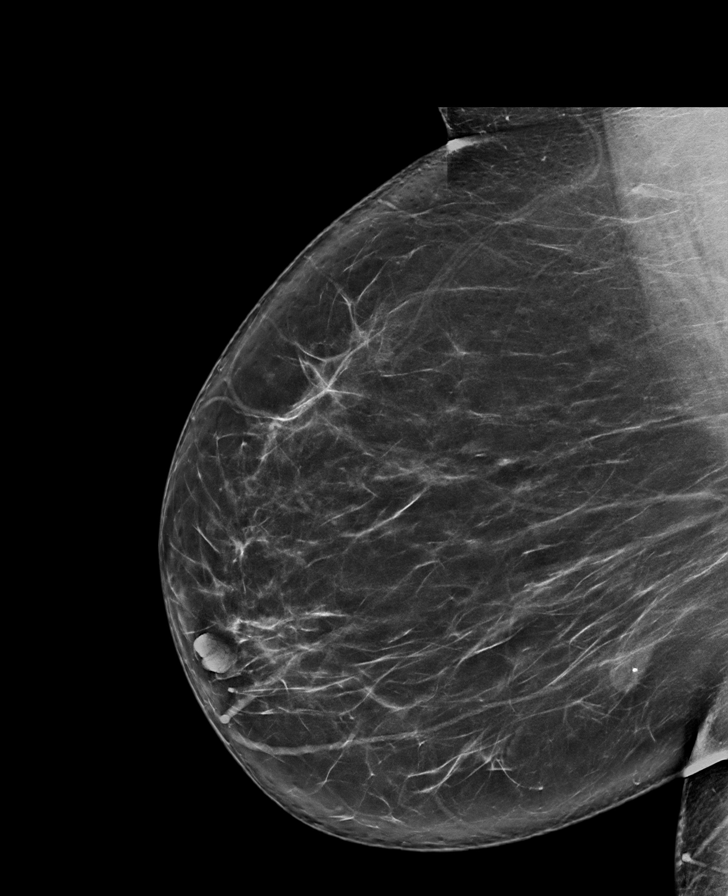

[L CC synth-2D]
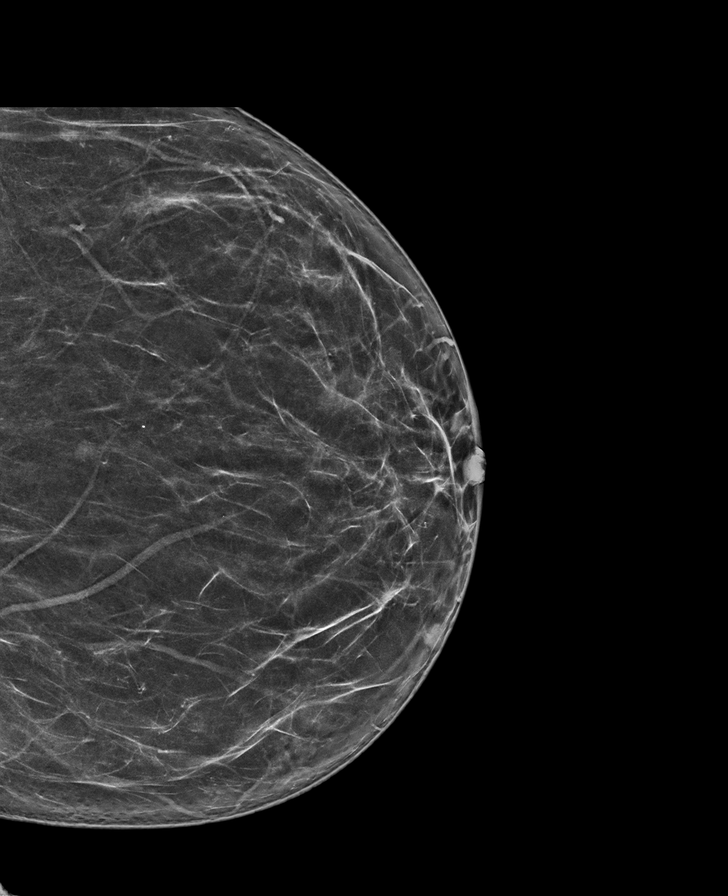

[R CC synth-2D]
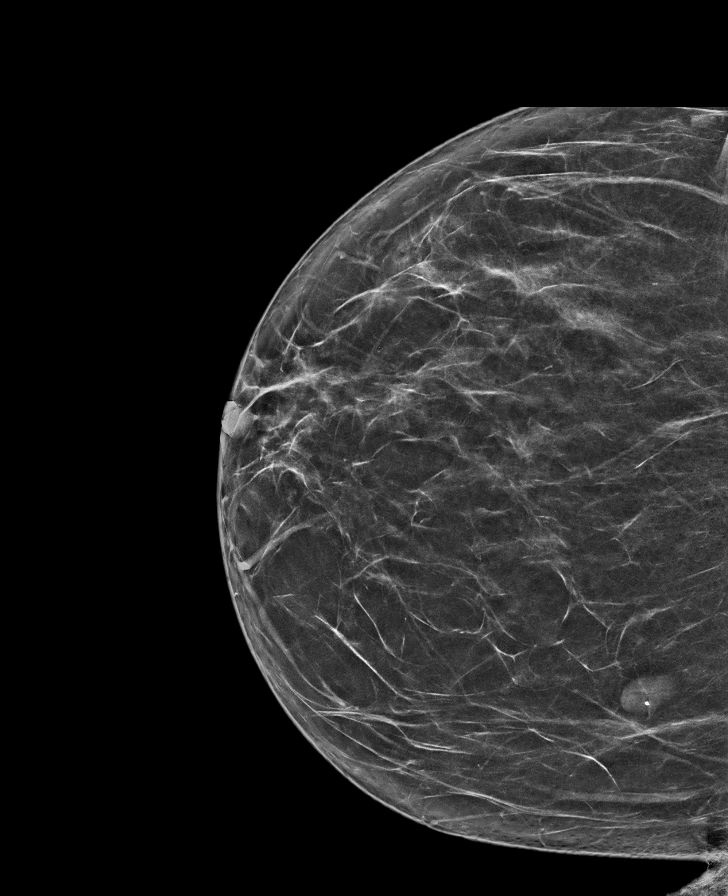

[L MLO synth-2D]
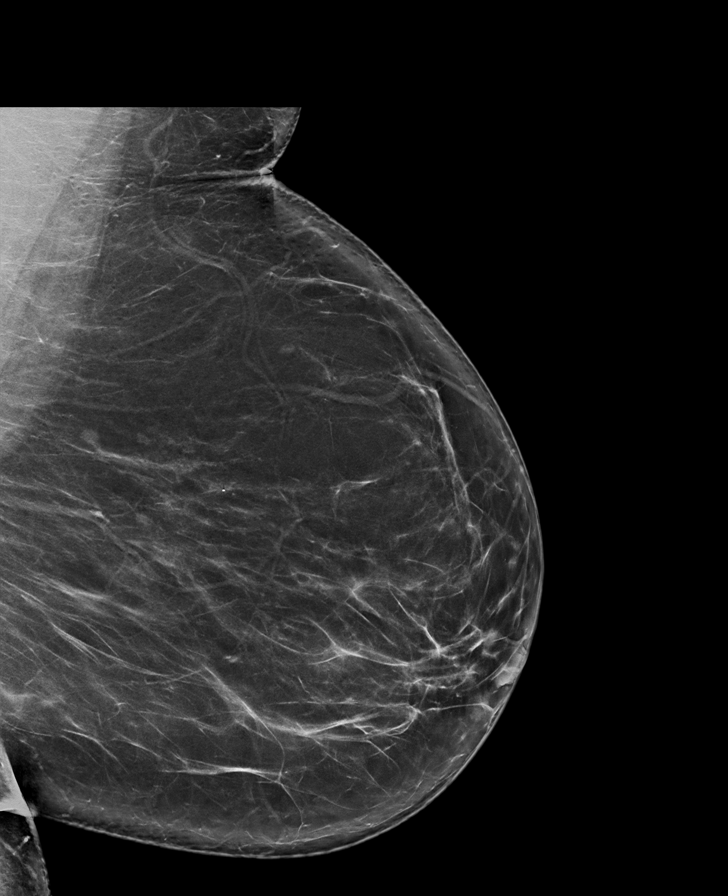

[R MLO tomo · tomo slice 48/95.0]
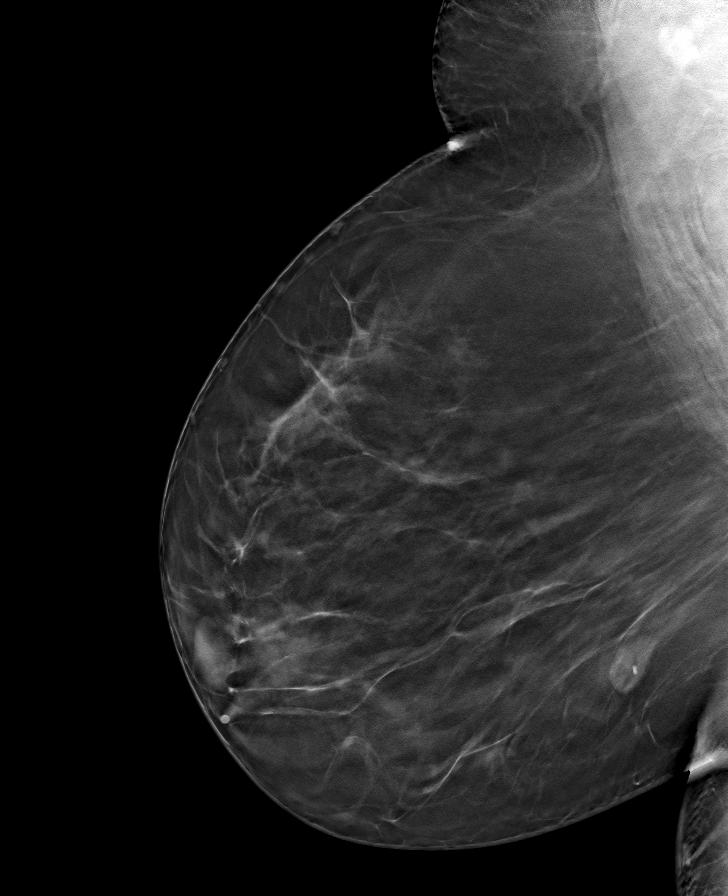

[L CC tomo · tomo slice 40/79.0]
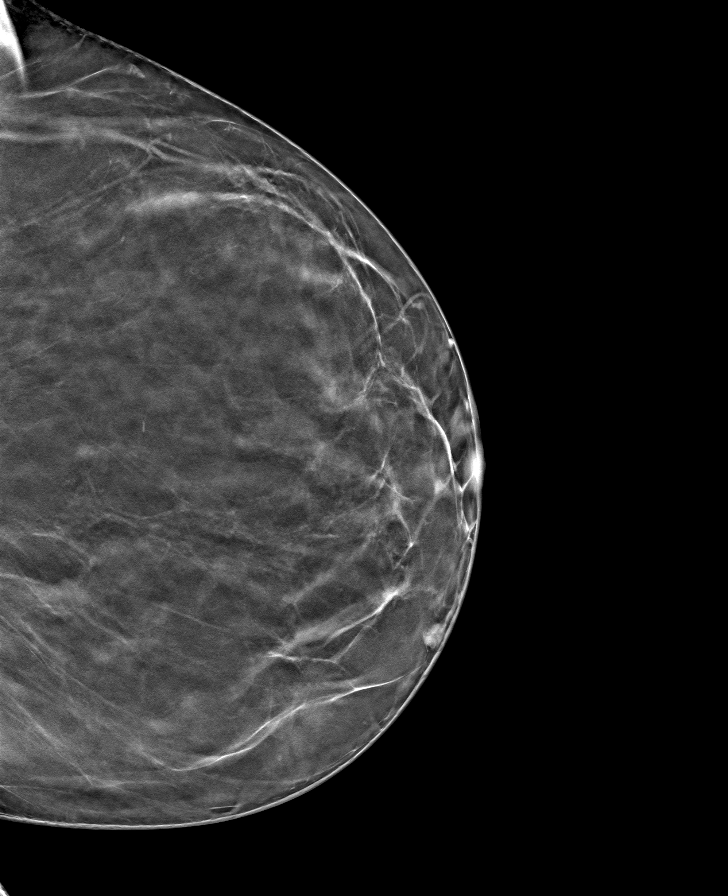

[L MLO tomo · tomo slice 48/95.0]
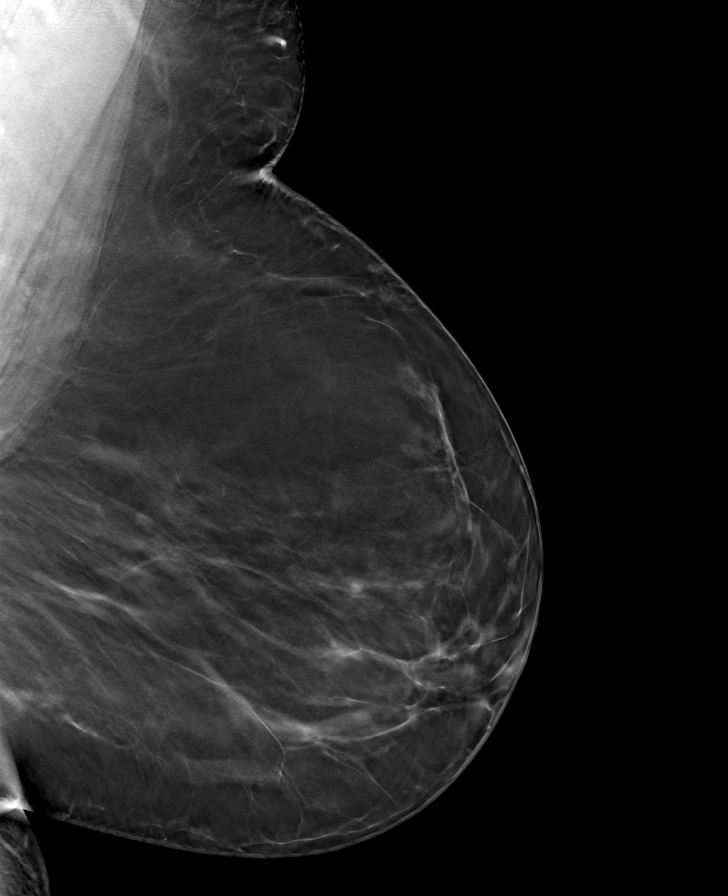

[R CC tomo · tomo slice 40/79.0]
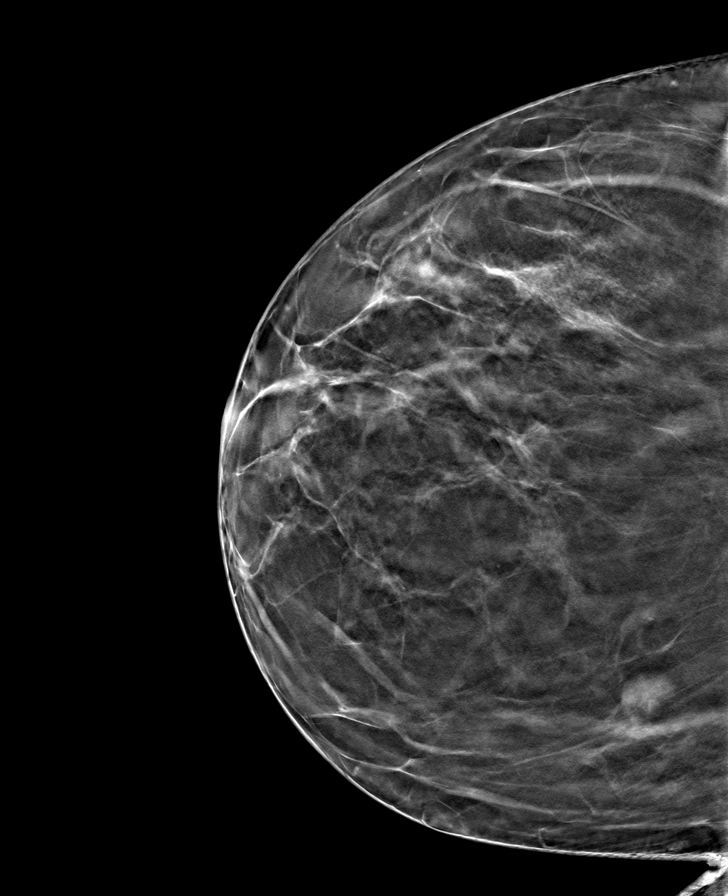

[8 of 24 positions shown; findings below may reference images not displayed]

ACR Breast Density Category b: There are scattered areas of
fibroglandular density.
FINDINGS: There are no findings suspicious for malignancy.
IMPRESSION: No mammographic evidence of malignancy. A result letter of this
screening mammogram will be mailed directly to the patient.

RECOMMENDATION:
Screening mammogram in one year. (Code:51-O-LD2)

BI-RADS CATEGORY  1: Negative.

## 2022-10-03 ENCOUNTER — Encounter: Payer: Self-pay | Admitting: Internal Medicine

## 2022-10-03 ENCOUNTER — Ambulatory Visit (INDEPENDENT_AMBULATORY_CARE_PROVIDER_SITE_OTHER): Payer: Managed Care, Other (non HMO) | Admitting: Internal Medicine

## 2022-10-03 VITALS — BP 144/82 | HR 85 | Ht 64.0 in | Wt 242.0 lb

## 2022-10-03 DIAGNOSIS — E041 Nontoxic single thyroid nodule: Secondary | ICD-10-CM | POA: Diagnosis not present

## 2022-10-03 DIAGNOSIS — Z23 Encounter for immunization: Secondary | ICD-10-CM

## 2022-10-03 DIAGNOSIS — E0500 Thyrotoxicosis with diffuse goiter without thyrotoxic crisis or storm: Secondary | ICD-10-CM

## 2022-10-03 LAB — TSH: TSH: 2.43 u[IU]/mL (ref 0.35–5.50)

## 2022-10-03 LAB — T4, FREE: Free T4: 0.76 ng/dL (ref 0.60–1.60)

## 2022-10-03 LAB — T3, FREE: T3, Free: 3.2 pg/mL (ref 2.3–4.2)

## 2022-10-03 NOTE — Progress Notes (Signed)
Patient ID: Tonya Stokes, female   DOB: 1975/12/20, 46 y.o.   MRN: [id number]   HPI  Tonya Stokes is a 46 y.o.-year-old female, originally from [facility], initially referred by her PCP, Dr. Luciana Axe, returning for follow-up for a right thyroid nodule and subclinical thyrotoxicosis/Graves' disease.  Last visit 1 year ago.  Interim history: She denies tremors, palpitations, unintentional weight loss, hot flashes.   She remains off methimazole.  Right thyroid nodule  -Palpated by PCP in 2019 during APE.  She did not have neck compression symptoms at that time.  Reviewed her thyroid nodule investigation:  Thyroid U/S (02/15/2018): Mildly heterogeneous thyroid, with right inferior 2.3 x 1.3 x 2 cm solid/almost completely solid, isoechoic nodule, with punctate echogenic foci - ACR TI-RADS score: 6 - FNA recommended.  Thyroid U/S (03/13/2018): Parenchymal Echotexture:  Mildly heterogenous Isthmus: 4 mm Right lobe: 6.6 x 2.0 x 2.6 cm Left lobe: 4.5 x 1.9 x 2.3 cm ___________________________________________   Nodule # 1: Location: Right; Inferior Maximum size: 2.4, previously 2.3 cm; Other 2 dimensions: 1.7 x 1.6, previously 1.3 x 2.0 cm Composition: solid/almost completely solid (2) Echogenicity: isoechoic (1) Shape: not taller-than-wide (0) Margins: ill-defined (0) Echogenic foci: punctate echogenic foci (3) ACR TI-RADS total points: 6.   **Given size (>/= 1.5 cm) and appearance, fine needle aspiration of this moderately suspicious nodule should be considered based on TI-RADS criteria. _________________________________________________________   The isthmus and left inferior lobe also demonstrate subcentimeter hypoechoic and isoechoic nodules, all measuring 9 mm or less. These would not meet criteria for any biopsy or follow-up. Normal vascularity. Mild nonspecific thyroid heterogeneity. No adenopathy.   IMPRESSION: 2.4 cm right inferior TR 4 nodule meets criteria for biopsy as  above.    FNA of this nodule (04/11/2018): AUS/FLUS, but the sample was not sent for Afirma molecular marker  Thyroid uptake and scan (10/16/2019):  Cold nodule at inferior pole of RIGHT thyroid lobe, corresponding to nodule seen on ultrasound. Uptake of tracer within remaining thyroid tissue is homogeneous. No additional areas of increased or decreased tracer localization seen.  4 hour I-123 uptake = 19.4% (normal 5-20%) 24 hour I-123 uptake = 41.3% (normal 10-30%)  IMPRESSION: Cold nodule at inferior pole of RIGHT thyroid lobe corresponding to nodule identified by prior ultrasound and previously biopsied in 2019, recommend correlation with pathology.  Elevated 24 hour radio iodine uptake consistent with hyperthyroidism.  Thyroid U/S (11/18/2019): Parenchymal Echotexture: Moderately heterogenous Isthmus: 0.4 cm Right lobe: 6.1 x 2.5 x 2.2 cm Left lobe: 5.4 x 1.9 x 2.3 cm ____________________________________________________   Nodule # 2: The previously biopsied nodule in the right inferior gland is essentially unchanged at 2.3 x 1.7 x 1.6 cm compared to 2.4 x 1.7 x 1.6 cm previously.   Nodule # 1: A small isoechoic solid nodule in the thyroid isthmus measures up to 1.3 cm. This nodule a does not meet criteria for biopsy or imaging surveillance.   IMPRESSION: No significant interval change in the size or appearance of the previously biopsied 2.3 cm nodule in the right inferior thyroid gland.  FNA (01/30/2020):  Clinical History: Nodule #2: The previously biopsied nodule in the right  Inferior gland is essentially unchanged at 2.3 x 1.7 x 1.6cm compared to  2.4 x 1.7 x 1.6cm previously, 04/21/18 [facility]  Specimen Submitted:  A. THYROID, RLP, FINE NEEDLE ASPIRATION:   FINAL MICROSCOPIC DIAGNOSIS:  - Follicular lesion of undetermined significance ([facility] category III)   SPECIMEN ADEQUACY:  Satisfactory for evaluation  AFIRMA: Benign (risk of malignancy up to  4%).  Thyroid U/S (10/06/2021):  Parenchymal Echotexture: Moderately heterogenous  Isthmus: 0.9 cm, previous 0.4 cm  Right lobe: 0.5 x 2.2 x 2.5 cm, previously 6.1 x 2.5 x 2.2 cm  Left lobe: 5.5 x 1.8 x 2.0 cm, previously 5.4 x 1.9 x 2.3 cm  _________________________________________________________   Interval decreased size of previously visualized ill-defined but benign-appearing isoechoic solid nodule in the thyroid isthmus (labeled 1, 0.9 cm, previously 1.3 cm).   Similar appearance of previously biopsied isoechoic solid nodule in the right inferior thyroid (labeled 2, 2.2 cm, previously 2.3 cm).   Unchanged benign-appearing isoechoic solid nodule in the left inferior thyroid (labeled 3, 0.8 cm, previously 0.9 cm).   No new discrete thyroid nodules.   No cervical lymphadenopathy.   IMPRESSION: 1. Similar appearing multinodular goiter. 2. Unchanged appearance of previously biopsied right inferior solid thyroid nodule (labeled 2, 2.2 cm, previously 2.3 cm). Recommend correlation with prior biopsy results.  Graves' disease: - dx'ed in 2020  Reviewed her TFTs: Lab Results  Component Value Date   TSH 1.71 09/28/2021   TSH 1.21 03/29/2021   TSH 1.24 09/30/2020   TSH 1.17 05/27/2020   TSH <0.01 (L) 01/22/2020   TSH <0.01 (L) 09/20/2019   TSH 4.06 03/25/2016   FREET4 0.67 09/28/2021   FREET4 0.64 03/29/2021   FREET4 0.71 09/30/2020   FREET4 0.71 05/27/2020   FREET4 1.18 01/22/2020   FREET4 1.14 09/20/2019  06/06/2019: TSH 0.040, free T4 1.1 (0.6-1.4), total T3 148 (62-194) 02/07/2018: TSH 5.09 (0.45-5.33)  Graves' antibodies were elevated: Lab Results  Component Value Date   TSI 106 03/29/2021   TSI 205 (H) 09/20/2019   We started methimazole 5 mg daily in 09/2019.  She was repeatedly off methimazole afterwards as she continued to misunderstand instructions but at last visit, in 03/2021, we continued off methimazole since TFTs and TSI's were normal.  Pt denies: -  feeling nodules in neck - hoarseness - dysphagia - choking  No FH of thyroid disease or cancer. No h/o radiation tx to head or neck. No herbal supplements. No Biotin use. No recent steroids use.   She saw cardiology >> was on a heart monitor.  ROS: + see HPI  I reviewed pt's medications, allergies, PMH, social hx, family hx, and changes were documented in the history of present illness. Otherwise, unchanged from my initial visit note.  Past Medical History:  Diagnosis Date   Graves disease    On atenolol and methimazole   History of cardiac murmur    Per chart review -> 2D echo in April 2017 suggested increased velocities across aortic valve.  No valvular or subvalvular stenosis.   Obesity, Class III, BMI 40-49.9 (morbid obesity) (HCC)    BMI 40.3   Prediabetes    Sickle cell trait [facility])    Per chart review   Past Surgical History:  Procedure Laterality Date   CESAREAN SECTION  09/02/2007   CESAREAN SECTION  02/27/2011   GXT/ETT  04/2016    Exercised 7 min = 8.5 METS.  Peak HR 166 bpm equals 92% MPHR (179 bpm).  Mild hypertensive response.  Upsloping ST segments-nondiagnostic.  NEGATIVE/ADEQUATE ETT   TRANSTHORACIC ECHOCARDIOGRAM  03/2016   Technically difficult study.  EF 60 to 65%.  Normal wall motion and diastolic parameters.  Aortic valve not well visualized minimally increased velocity across the valve.   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  No prescription medications  No current outpatient medications on file prior to visit.   No current facility-administered medications on file prior to visit.   No Known Allergies   Family History  Problem Relation Age of Onset   Irregular heart beat Mother    Other Father        Unknown, daughter early age   Breast cancer Neg Hx   + Heart disease and HL in mother.  PE: BP (!) 144/82 (BP Location: Left Arm, Patient Position: Sitting, Cuff Size: Normal)   Pulse 85   Ht 5\' 4"  (1.626 m)   Wt 242 lb (109.8 kg)   SpO2 95%   BMI 41.54 kg/m  Wt Readings from Last 3 Encounters:  10/03/22 242 lb (109.8 kg)  09/28/21 242 lb 3.2 oz (109.9 kg)  03/29/21 242 lb 12.8 oz (110.1 kg)   Constitutional: overweight, in NAD Eyes: EOMI, no exophthalmos ENT: +  large thyroid nodule palpated in the right thyroid lobe and isthmus, no cervical lymphadenopathy Cardiovascular: RRR, No MRG, no LE edema Respiratory: CTA B Musculoskeletal: no deformities Skin: no rashes Neurological: no tremor with outstretched hands  ASSESSMENT: 1. Thyroid nodule -in inferior right lobe  2.  Subclinical thyrotoxicosis  PLAN: 1. Thyroid nodule -Patient had a large dominant nodule which was biopsied in 04/2018 with inconclusive results (AUS/FLUS).  At that time, unfortunately, the results were not sent for the Afirma molecular marker.  The nodule appeared to be stable on a subsequent ultrasound.  A thyroid uptake and scan showed that the nodule was cold.  We repeated an FNA in 2021 with inconclusive results (FLUS), however, the Afirma molecular marker returned benign, which is associated with a low risk for cancer, <4%. -At last visit, we repeated her ultrasound and the nodules were stable -She denies neck  compression symptoms -We we will repeat her neck ultrasound at next visit  2.  Graves disease -Manifesting with subclinical thyrotoxicosis + See HPI -She had normal thyroid function in 2017 and 2019 but the TSH became suppressed in 2020.  Free thyroid hormones were normal.  TSI's were elevated.  A thyroid scan was homogeneous except for cold nodule and she also had an elevated iodine uptake, confirming Graves' disease.  We initially started methimazole, 5 mg daily, but she stopped this right after the thyroid uptake and scan as she misunderstood instructions.  Since the TSH remained low, we resumed methimazole.  She had an appointment with cardiology (Dr. 2021) afterwards, who advised her to taper down and then stop atenolol.  She misunderstood instructions and stopped both methimazole and atenolol.  Fortunately, her TFTs were normal afterwards  and TSI has normalized, so we continued off methimazole.  At last visit, she was asymptomatic and TFTs were normal.  We continued without methimazole. -At today's visit, she remains asymptomatic.  Pulse is normal. -We will check her TFTs and see if we need to restart methimazole -I will have her return in 1 year  + flu shot today   Component     Latest Ref Rng 10/03/2022  TSH     0.35 - 5.50 uIU/mL 2.43   T4,Free(Direct)     0.60 - 1.60 ng/dL 3.78   Triiodothyronine,Free,Serum     2.3 - 4.2 pg/mL 3.2   TFTs are normal.  Carlus Pavlov, MD PhD [facility] Endocrinology

## 2022-10-03 NOTE — Patient Instructions (Addendum)
Please stop at the lab.  Please continue to stay off Methimazole for now.  Please come back for a follow-up appointment in 1 year.

## 2022-12-06 ENCOUNTER — Other Ambulatory Visit: Payer: Self-pay | Admitting: Family Medicine

## 2022-12-06 DIAGNOSIS — Z1231 Encounter for screening mammogram for malignant neoplasm of breast: Secondary | ICD-10-CM

## 2023-01-25 ENCOUNTER — Ambulatory Visit: Payer: Managed Care, Other (non HMO)

## 2023-01-27 ENCOUNTER — Ambulatory Visit
Admission: RE | Admit: 2023-01-27 | Discharge: 2023-01-27 | Disposition: A | Discharge status: Home / Self Care | Payer: Managed Care, Other (non HMO) | Source: Ambulatory Visit | Attending: Family Medicine | Admitting: Family Medicine

## 2023-01-27 DIAGNOSIS — Z1231 Encounter for screening mammogram for malignant neoplasm of breast: Secondary | ICD-10-CM

## 2023-02-01 ENCOUNTER — Other Ambulatory Visit: Payer: Self-pay | Admitting: Family Medicine

## 2023-02-01 DIAGNOSIS — N6320 Unspecified lump in the left breast, unspecified quadrant: Secondary | ICD-10-CM

## 2023-02-06 ENCOUNTER — Ambulatory Visit
Admission: RE | Admit: 2023-02-06 | Discharge: 2023-02-06 | Disposition: A | Discharge status: Home / Self Care | Payer: Managed Care, Other (non HMO) | Source: Ambulatory Visit | Attending: Family Medicine | Admitting: Family Medicine

## 2023-02-06 DIAGNOSIS — N6320 Unspecified lump in the left breast, unspecified quadrant: Secondary | ICD-10-CM

## 2023-03-20 ENCOUNTER — Ambulatory Visit: Payer: Managed Care, Other (non HMO)

## 2023-10-05 ENCOUNTER — Encounter: Payer: Self-pay | Admitting: Internal Medicine

## 2023-10-05 ENCOUNTER — Ambulatory Visit (INDEPENDENT_AMBULATORY_CARE_PROVIDER_SITE_OTHER): Payer: Managed Care, Other (non HMO) | Admitting: Internal Medicine

## 2023-10-05 VITALS — BP 130/80 | HR 85 | Ht 64.0 in | Wt 238.8 lb

## 2023-10-05 DIAGNOSIS — E041 Nontoxic single thyroid nodule: Secondary | ICD-10-CM

## 2023-10-05 DIAGNOSIS — E0500 Thyrotoxicosis with diffuse goiter without thyrotoxic crisis or storm: Secondary | ICD-10-CM | POA: Diagnosis not present

## 2023-10-05 NOTE — Progress Notes (Signed)
Patient ID: Tonya Stokes, female   DOB: Sep 29, 1976, 47 y.o.   MRN: [id number]   HPI  Tonya Stokes is a 47 y.o.-year-old female, originally from [facility], initially referred by her PCP, Dr. Leavy Cella, returning for follow-up for a right thyroid nodule and subclinical thyrotoxicosis/Graves' disease.  Last visit 1 year ago.  Interim history: She denies tremors, palpitations, unintentional weight loss, hot flashes.  Also, no problems swallowing, choking, neck pressure. She remains off methimazole.  Right thyroid nodule  -Palpated by PCP in 2019 during APE.  She did not have neck compression symptoms at that time.  Reviewed her thyroid nodule investigation:  Thyroid U/S (02/15/2018): Mildly heterogeneous thyroid, with right inferior 2.3 x 1.3 x 2 cm solid/almost completely solid, isoechoic nodule, with punctate echogenic foci - ACR TI-RADS score: 6 - FNA recommended.  Thyroid U/S (03/13/2018): Parenchymal Echotexture:  Mildly heterogenous Isthmus: 4 mm Right lobe: 6.6 x 2.0 x 2.6 cm Left lobe: 4.5 x 1.9 x 2.3 cm ___________________________________________   Nodule # 1: Location: Right; Inferior Maximum size: 2.4, previously 2.3 cm; Other 2 dimensions: 1.7 x 1.6, previously 1.3 x 2.0 cm Composition: solid/almost completely solid (2) Echogenicity: isoechoic (1) Shape: not taller-than-wide (0) Margins: ill-defined (0) Echogenic foci: punctate echogenic foci (3) ACR TI-RADS total points: 6.   **Given size (>/= 1.5 cm) and appearance, fine needle aspiration of this moderately suspicious nodule should be considered based on TI-RADS criteria. _________________________________________________________   The isthmus and left inferior lobe also demonstrate subcentimeter hypoechoic and isoechoic nodules, all measuring 9 mm or less. These would not meet criteria for any biopsy or follow-up. Normal vascularity. Mild nonspecific thyroid heterogeneity. No adenopathy.   IMPRESSION: 2.4 cm  right inferior TR 4 nodule meets criteria for biopsy as above.    FNA of this nodule (04/11/2018): AUS/FLUS, but the sample was not sent for Afirma molecular marker  Thyroid uptake and scan (10/16/2019):  Cold nodule at inferior pole of RIGHT thyroid lobe, corresponding to nodule seen on ultrasound. Uptake of tracer within remaining thyroid tissue is homogeneous. No additional areas of increased or decreased tracer localization seen.  4 hour I-123 uptake = 19.4% (normal 5-20%) 24 hour I-123 uptake = 41.3% (normal 10-30%)  IMPRESSION: Cold nodule at inferior pole of RIGHT thyroid lobe corresponding to nodule identified by prior ultrasound and previously biopsied in 2019, recommend correlation with pathology.  Elevated 24 hour radio iodine uptake consistent with hyperthyroidism.  Thyroid U/S (11/18/2019): Parenchymal Echotexture: Moderately heterogenous Isthmus: 0.4 cm Right lobe: 6.1 x 2.5 x 2.2 cm Left lobe: 5.4 x 1.9 x 2.3 cm ____________________________________________________   Nodule # 2: The previously biopsied nodule in the right inferior gland is essentially unchanged at 2.3 x 1.7 x 1.6 cm compared to 2.4 x 1.7 x 1.6 cm previously.   Nodule # 1: A small isoechoic solid nodule in the thyroid isthmus measures up to 1.3 cm. This nodule a does not meet criteria for biopsy or imaging surveillance.   IMPRESSION: No significant interval change in the size or appearance of the previously biopsied 2.3 cm nodule in the right inferior thyroid gland.  FNA (01/30/2020):  Clinical History: Nodule #2: The previously biopsied nodule in the right  Inferior gland is essentially unchanged at 2.3 x 1.7 x 1.6cm compared to  2.4 x 1.7 x 1.6cm previously, 04/21/18 [facility]  Specimen Submitted:  A. THYROID, RLP, FINE NEEDLE ASPIRATION:   FINAL MICROSCOPIC DIAGNOSIS:  - Follicular lesion of undetermined significance ([facility] category III)   SPECIMEN ADEQUACY:  Satisfactory for  evaluation   AFIRMA: Benign (risk of malignancy up to 4%).  Thyroid U/S (10/06/2021):  Parenchymal Echotexture: Moderately heterogenous  Isthmus: 0.9 cm, previous 0.4 cm  Right lobe: 0.5 x 2.2 x 2.5 cm, previously 6.1 x 2.5 x 2.2 cm  Left lobe: 5.5 x 1.8 x 2.0 cm, previously 5.4 x 1.9 x 2.3 cm  _________________________________________________________   Interval decreased size of previously visualized ill-defined but benign-appearing isoechoic solid nodule in the thyroid isthmus (labeled 1, 0.9 cm, previously 1.3 cm).   Similar appearance of previously biopsied isoechoic solid nodule in the right inferior thyroid (labeled 2, 2.2 cm, previously 2.3 cm).   Unchanged benign-appearing isoechoic solid nodule in the left inferior thyroid (labeled 3, 0.8 cm, previously 0.9 cm).   No new discrete thyroid nodules.   No cervical lymphadenopathy.   IMPRESSION: 1. Similar appearing multinodular goiter. 2. Unchanged appearance of previously biopsied right inferior solid thyroid nodule (labeled 2, 2.2 cm, previously 2.3 cm). Recommend correlation with prior biopsy results.  Graves' disease: - dx'ed in 2020  Reviewed her TFTs: 09/26/2023: TSH 4.234 05/23/2023: TSH 3.768 Lab Results  Component Value Date   TSH 2.43 10/03/2022   TSH 1.71 09/28/2021   TSH 1.21 03/29/2021   TSH 1.24 09/30/2020   TSH 1.17 05/27/2020   TSH <0.01 (L) 01/22/2020   TSH <0.01 (L) 09/20/2019   TSH 4.06 03/25/2016   FREET4 0.76 10/03/2022   FREET4 0.67 09/28/2021   FREET4 0.64 03/29/2021   FREET4 0.71 09/30/2020   FREET4 0.71 05/27/2020   FREET4 1.18 01/22/2020   FREET4 1.14 09/20/2019  06/06/2019: TSH 0.040, free T4 1.1 (0.6-1.4), total T3 148 (62-194) 02/07/2018: TSH 5.09 (0.45-5.33)  Graves' antibodies were elevated: Lab Results  Component Value Date   TSI 106 03/29/2021   TSI 205 (H) 09/20/2019   We started methimazole 5 mg daily in 09/2019.  She was repeatedly off methimazole afterwards as she  continued to misunderstand instructions but at last visit, in 03/2021, we continued off methimazole since TFTs and TSI's were normal.  Pt denies: - feeling nodules in neck - hoarseness - dysphagia - choking  No FH of thyroid disease or cancer. No h/o radiation tx to head or neck. No herbal supplements. No Biotin use. No recent steroids use.   She saw cardiology >> was on a heart monitor. She had palpitations >> resolved. She has a history of diabetes.  HbA1c on 09/26/2023 was lower, at 7.7%, decreased from 8.2%.  This is managed by PCP.  She is on metformin.  ROS: + see HPI  I reviewed pt's medications, allergies, PMH, social hx, family hx, and changes were documented in the history of present illness. Otherwise, unchanged from my initial visit note.  Past Medical History:  Diagnosis Date   Graves disease    On atenolol and methimazole   History of cardiac murmur    Per chart review -> 2D echo in April 2017 suggested increased velocities across aortic valve.  No valvular or subvalvular stenosis.   Obesity, Class III, BMI 40-49.9 (morbid obesity) (HCC)    BMI 40.3   Prediabetes    Sickle cell trait [facility])    Per chart review   Past Surgical History:  Procedure Laterality Date   CESAREAN SECTION  09/02/2007   CESAREAN SECTION  02/27/2011   GXT/ETT  04/2016    Exercised 7 min = 8.5 METS.  Peak HR 166 bpm equals 92% MPHR (179 bpm).  Mild hypertensive response.  Upsloping ST  segments-nondiagnostic.  NEGATIVE/ADEQUATE ETT   TRANSTHORACIC ECHOCARDIOGRAM  03/2016   Technically difficult study.  EF 60 to 65%.  Normal wall motion and diastolic parameters.  Aortic valve not well visualized minimally increased velocity across the valve.   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  No prescription medications  Current Outpatient Medications on File Prior to Visit  Medication Sig Dispense Refill   atorvastatin (LIPITOR) 10 MG tablet Take 10 mg by mouth daily.     metFORMIN (GLUCOPHAGE-XR) 500 MG 24 hr tablet Take by mouth.     No current facility-administered medications on file prior to visit.   No Known Allergies   Family History  Problem Relation Age of Onset   Irregular heart beat Mother    Other Father        Unknown, daughter early age   Breast cancer Neg Hx   + Heart disease and HL in mother.  PE: BP 130/80   Pulse 85   Ht 5\' 4"  (1.626 m)   Wt 238 lb 12.8 oz (108.3 kg)   SpO2 99%   BMI 40.99 kg/m  Wt Readings from Last 3 Encounters:  10/05/23 238 lb 12.8 oz (108.3 kg)  10/03/22 242 lb (109.8 kg)  09/28/21 242 lb 3.2 oz (109.9 kg)   Constitutional: overweight, in NAD Eyes: EOMI, no exophthalmos ENT: +  large thyroid nodule palpated in the right thyroid lobe and isthmus, no cervical lymphadenopathy Cardiovascular: RRR, No MRG,  + mild LE edema Respiratory: CTA B Musculoskeletal: no deformities Skin: no rashes Neurological: no tremor with outstretched hands  ASSESSMENT: 1. Thyroid nodule -in inferior right lobe  2.  Subclinical thyrotoxicosis  PLAN: 1. Thyroid nodule -Patient with a history of a large dominant nodule which was biopsied in 04/2018 with inconclusive results (AUS/FLUS).  At that time, unfortunately, the  results were not sent for the Afirma molecular marker.  The nodule appears to be stable on a subsequent ultrasound.  Thyroid uptake and scan showed that the nodule was cold.  We repeated an FNA in 2021 with again inconclusive results (FLUS), but the Afirma molecular marker returned benign, which we discussed was associated with a low risk for cancer, less than 4%. -We repeated a thyroid ultrasound in 2022 and the nodule appeared unchanged -No neck compression symptoms -We will repeat a thyroid ultrasound now  2.  Graves disease -Manifesting with subclinical thyrotoxicosis -She had normal thyroid function in 2017 in 2019 but the TSH became suppressed in 2020.  Free thyroid hormones were normal.  TSI's were elevated.  Thyroid scan was homogeneous except for "nodule and  she also had an elevated iodine uptake, confirming Graves' disease.  We initially started methimazole, 5 mg daily, but she stopped this after the thyroid uptake and scan as she misunderstood instructions.  Since the TSH remained low, we resumed methimazole.  Her cardiologist, Dr. Herbie Baltimore recommended to taper down atenolol and then stop.  She misunderstood instructions and stopped both methimazole and atenolol.  Fortunately, her TFTs were normal afterwards and TSI's have normalized, so we continued off methimazole.  At last 3 checks her TFTs were normal so we did not have to restart methimazole.  Latest TSH was reviewed: 05/23/2023: Normal, at 3.768, 09/26/2023: Normal, at 4.234 -At today's visit she denies thyrotoxic signs or symptoms: No tremors, palpitations, heat intolerance, anxiety, insomnia, unintentional weight loss -No need to start methimazole back for now -Otherwise, I plan to see her back in a year  Orders Placed This Encounter  Procedures   US THYROID   Carlus Pavlov, MD PhD [facility]

## 2023-10-05 NOTE — Patient Instructions (Addendum)
Please continue to stay off Methimazole for now.  Let's check another thyroid U/S.  Please come back for a follow-up appointment in 1 year.

## 2023-10-06 ENCOUNTER — Ambulatory Visit
Admission: RE | Admit: 2023-10-06 | Discharge: 2023-10-06 | Disposition: A | Discharge status: Home / Self Care | Payer: Managed Care, Other (non HMO) | Source: Ambulatory Visit | Attending: Internal Medicine | Admitting: Internal Medicine

## 2023-10-06 DIAGNOSIS — E041 Nontoxic single thyroid nodule: Secondary | ICD-10-CM

## 2024-01-08 ENCOUNTER — Other Ambulatory Visit: Payer: Self-pay | Admitting: Family Medicine

## 2024-01-08 DIAGNOSIS — Z1231 Encounter for screening mammogram for malignant neoplasm of breast: Secondary | ICD-10-CM

## 2024-02-09 ENCOUNTER — Ambulatory Visit
Admission: RE | Admit: 2024-02-09 | Discharge: 2024-02-09 | Disposition: A | Discharge status: Home / Self Care | Payer: Managed Care, Other (non HMO) | Source: Ambulatory Visit | Attending: Family Medicine | Admitting: Family Medicine

## 2024-02-09 DIAGNOSIS — Z1231 Encounter for screening mammogram for malignant neoplasm of breast: Secondary | ICD-10-CM

## 2024-09-30 ENCOUNTER — Ambulatory Visit: Payer: Managed Care, Other (non HMO) | Admitting: Internal Medicine

## 2024-09-30 ENCOUNTER — Encounter: Payer: Self-pay | Admitting: Internal Medicine

## 2024-09-30 ENCOUNTER — Other Ambulatory Visit

## 2024-09-30 VITALS — BP 138/80 | HR 83 | Ht 64.0 in | Wt 239.8 lb

## 2024-09-30 DIAGNOSIS — E041 Nontoxic single thyroid nodule: Secondary | ICD-10-CM | POA: Diagnosis not present

## 2024-09-30 DIAGNOSIS — E0500 Thyrotoxicosis with diffuse goiter without thyrotoxic crisis or storm: Secondary | ICD-10-CM

## 2024-09-30 LAB — T3, FREE: T3, Free: 3 pg/mL (ref 2.3–4.2)

## 2024-09-30 LAB — T4, FREE: Free T4: 0.9 ng/dL (ref 0.8–1.8)

## 2024-09-30 LAB — TSH: TSH: 3.7 m[IU]/L

## 2024-09-30 NOTE — Progress Notes (Signed)
 Patient ID: Tonya Stokes, female   DOB: July 23, 1976, 48 y.o.   MRN: [id number]   HPI  Tonya Stokes is a 48 y.o.-year-old female, originally from [facility], initially referred by her PCP, Dr. Jolee, returning for follow-up for a right thyroid  nodule and subclinical thyrotoxicosis/Graves' disease.  Last visit 1 year ago.  Interim history: She denies tremors, palpitations, unintentional weight loss, hot flashes.   Also, no problems swallowing, choking, neck pressure. She remains off methimazole .  Right thyroid  nodule  -Palpated by PCP in 2019 during APE.  She did not have neck compression symptoms at that time.  Reviewed her thyroid  nodule investigation:  Thyroid  U/S (02/15/2018): Mildly heterogeneous thyroid , with right inferior 2.3 x 1.3 x 2 cm solid/almost completely solid, isoechoic nodule, with punctate echogenic foci - ACR TI-RADS score: 6 - FNA recommended.  Thyroid  U/S (03/13/2018): Parenchymal Echotexture:  Mildly heterogenous Isthmus: 4 mm Right lobe: 6.6 x 2.0 x 2.6 cm Left lobe: 4.5 x 1.9 x 2.3 cm ___________________________________________   Nodule # 1: Location: Right; Inferior Maximum size: 2.4, previously 2.3 cm; Other 2 dimensions: 1.7 x 1.6, previously 1.3 x 2.0 cm Composition: solid/almost completely solid (2) Echogenicity: isoechoic (1) Shape: not taller-than-wide (0) Margins: ill-defined (0) Echogenic foci: punctate echogenic foci (3) ACR TI-RADS total points: 6.   **Given size (>/= 1.5 cm) and appearance, fine needle aspiration of this moderately suspicious nodule should be considered based on TI-RADS criteria. _________________________________________________________   The isthmus and left inferior lobe also demonstrate subcentimeter hypoechoic and isoechoic nodules, all measuring 9 mm or less. These would not meet criteria for any biopsy or follow-up. Normal vascularity. Mild nonspecific thyroid  heterogeneity. No adenopathy.   IMPRESSION: 2.4 cm  right inferior TR 4 nodule meets criteria for biopsy as above.    FNA of this nodule (04/11/2018): AUS/FLUS, but the sample was not sent for Afirma molecular marker  Thyroid  uptake and scan (10/16/2019):  Cold nodule at inferior pole of RIGHT thyroid  lobe, corresponding to nodule seen on ultrasound. Uptake of tracer within remaining thyroid  tissue is homogeneous. No additional areas of increased or decreased tracer localization seen.  4 hour I-123 uptake = 19.4% (normal 5-20%) 24 hour I-123 uptake = 41.3% (normal 10-30%)  IMPRESSION: Cold nodule at inferior pole of RIGHT thyroid  lobe corresponding to nodule identified by prior ultrasound and previously biopsied in 2019, recommend correlation with pathology.  Elevated 24 hour radio iodine uptake consistent with hyperthyroidism.  Thyroid  U/S (11/18/2019): Parenchymal Echotexture: Moderately heterogenous Isthmus: 0.4 cm Right lobe: 6.1 x 2.5 x 2.2 cm Left lobe: 5.4 x 1.9 x 2.3 cm ____________________________________________________   Nodule # 2: The previously biopsied nodule in the right inferior gland is essentially unchanged at 2.3 x 1.7 x 1.6 cm compared to 2.4 x 1.7 x 1.6 cm previously.   Nodule # 1: A small isoechoic solid nodule in the thyroid  isthmus measures up to 1.3 cm. This nodule a does not meet criteria for biopsy or imaging surveillance.   IMPRESSION: No significant interval change in the size or appearance of the previously biopsied 2.3 cm nodule in the right inferior thyroid  gland.  FNA (01/30/2020):  Clinical History: Nodule #2: The previously biopsied nodule in the right  Inferior gland is essentially unchanged at 2.3 x 1.7 x 1.6cm compared to  2.4 x 1.7 x 1.6cm previously, 04/21/18 [facility]  Specimen Submitted:  A. THYROID , RLP, FINE NEEDLE ASPIRATION:   FINAL MICROSCOPIC DIAGNOSIS:  - Follicular lesion of undetermined significance ([facility] category III)   SPECIMEN  ADEQUACY:  Satisfactory for  evaluation   AFIRMA: Benign (risk of malignancy up to 4%).  Thyroid  U/S (10/06/2021):  Parenchymal Echotexture: Moderately heterogenous  Isthmus: 0.9 cm, previous 0.4 cm  Right lobe: 0.5 x 2.2 x 2.5 cm, previously 6.1 x 2.5 x 2.2 cm  Left lobe: 5.5 x 1.8 x 2.0 cm, previously 5.4 x 1.9 x 2.3 cm  _________________________________________________________   Interval decreased size of previously visualized ill-defined but benign-appearing isoechoic solid nodule in the thyroid  isthmus (labeled 1, 0.9 cm, previously 1.3 cm).   Similar appearance of previously biopsied isoechoic solid nodule in the right inferior thyroid  (labeled 2, 2.2 cm, previously 2.3 cm).   Unchanged benign-appearing isoechoic solid nodule in the left inferior thyroid  (labeled 3, 0.8 cm, previously 0.9 cm).   No new discrete thyroid  nodules.   No cervical lymphadenopathy.   IMPRESSION: 1. Similar appearing multinodular goiter. 2. Unchanged appearance of previously biopsied right inferior solid thyroid  nodule (labeled 2, 2.2 cm, previously 2.3 cm). Recommend correlation with prior biopsy results.  Thyroid  U/S (10/06/2023): Parenchymal Echotexture: Moderately heterogenous  Isthmus: 0.7 cm  Right lobe: 37.7 cm x 2.1 cm x 3.0 cm  Left lobe: 6.0 cm x 2.1 cm x 2.4 cm  _________________________________________________________   Estimated total number of nodules >/= 1 cm: 2 _________________________________________________________   Nodule labeled 1 inferior right thyroid , relatively unchanged in size and configuration, 2.4 cm. This nodule has undergone biopsy on 2 prior occasion. Assuming benign result no further specific follow-up would be indicated.   Nodule labeled 2 in the mid left thyroid , 1.1 cm. Nodule has TR 3 characteristics and does not meet criteria for surveillance.   No adenopathy   IMPRESSION: Similar appearance of heterogeneous enlarged thyroid , as well as previously biopsied right inferior  thyroid  nodule as above.  Graves' disease: - dx'ed in 2020  Reviewed her TFTs: 09/26/2023: TSH 4.234 05/23/2023: TSH 3.768 Lab Results  Component Value Date   TSH 2.43 10/03/2022   TSH 1.71 09/28/2021   TSH 1.21 03/29/2021   TSH 1.24 09/30/2020   TSH 1.17 05/27/2020   TSH <0.01 (L) 01/22/2020   TSH <0.01 (L) 09/20/2019   TSH 4.06 03/25/2016   FREET4 0.76 10/03/2022   FREET4 0.67 09/28/2021   FREET4 0.64 03/29/2021   FREET4 0.71 09/30/2020   FREET4 0.71 05/27/2020   FREET4 1.18 01/22/2020   FREET4 1.14 09/20/2019  06/06/2019: TSH 0.040, free T4 1.1 (0.6-1.4), total T3 148 (62-194) 02/07/2018: TSH 5.09 (0.45-5.33)  Graves' antibodies were elevated: Lab Results  Component Value Date   TSI 106 03/29/2021   TSI 205 (H) 09/20/2019   We started methimazole  5 mg daily in 09/2019.  She was repeatedly off methimazole  afterwards as she continued to misunderstand instructions but at last visit, in 03/2021, we continued off methimazole  since TFTs and TSI's were normal.  Pt denies: - feeling nodules in neck - hoarseness - dysphagia - choking  No FH of thyroid  disease or cancer. No h/o radiation tx to head or neck. No herbal supplements. No Biotin use. No recent steroids use.   She saw cardiology >> was on a heart monitor. She had palpitations >> resolved. She has a history of diabetes, managed by PCP.  She is on metformin.  Has hyperlipidemia and is on Lipitor.  ROS: + see HPI  I reviewed pt's medications, allergies, PMH, social hx, family hx, and changes were documented in the history of present illness. Otherwise, unchanged from my initial visit note.  Past Medical History:  Diagnosis Date   Graves disease    On atenolol  and methimazole    History of cardiac murmur    Per chart review -> 2D echo in April 2017 suggested increased velocities across aortic valve.  No valvular or subvalvular stenosis.   Obesity, Class III, BMI 40-49.9 (morbid obesity) (HCC)    BMI 40.3    Prediabetes    Sickle cell trait    Per chart review   Past Surgical History:  Procedure Laterality Date   CESAREAN SECTION  09/02/2007   CESAREAN SECTION  02/27/2011   GXT/ETT  04/2016    Exercised 7 min = 8.5 METS.  Peak HR 166 bpm equals 92% MPHR (179 bpm).  Mild hypertensive response.  Upsloping ST segments-nondiagnostic.  NEGATIVE/ADEQUATE ETT   TRANSTHORACIC ECHOCARDIOGRAM  03/2016   Technically difficult study.  EF 60 to 65%.  Normal wall motion and diastolic parameters.  Aortic valve not well visualized minimally increased velocity across the valve.   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  No prescription medications  Current Outpatient Medications on File Prior to Visit  Medication Sig Dispense Refill   atorvastatin (LIPITOR) 10 MG tablet Take 10 mg by mouth daily. (Patient not taking: Reported on 10/05/2023)     metFORMIN (GLUCOPHAGE-XR) 500 MG 24 hr tablet Take by mouth.     No current facility-administered medications on file prior to visit.   No Known Allergies   Family History  Problem Relation Age of Onset   Irregular heart beat Mother    Other Father        Unknown, daughter early age   Breast cancer Neg Hx   + Heart disease and HL in  mother.  PE: BP 138/80   Pulse 83   Ht 5' 4 (1.626 m)   Wt 239 lb 12.8 oz (108.8 kg)   SpO2 97%   BMI 41.16 kg/m  Wt Readings from Last 3 Encounters:  09/30/24 239 lb 12.8 oz (108.8 kg)  10/05/23 238 lb 12.8 oz (108.3 kg)  10/03/22 242 lb (109.8 kg)   Constitutional: overweight, in NAD Eyes: EOMI, no exophthalmos ENT: +  large thyroid  nodule palpated in the right thyroid  lobe and isthmus, no cervical lymphadenopathy Cardiovascular: RRR, No MRG,  + mild LE edema Respiratory: CTA B Musculoskeletal: no deformities Skin: no rashes Neurological: no tremor with outstretched hands  ASSESSMENT: 1. Thyroid  nodule -in inferior right lobe  2.  Subclinical thyrotoxicosis  PLAN: 1. Thyroid  nodule - Patient with a history of a large dominant nodule which was biopsied in 04/2018 with inconclusive results (AUS/FLUS).  At that time, unfortunately,  the results were not sent for the Afirma molecular marker.  The nodule appeared to be stable on subsequent ultrasounds.  Thyroid  uptake and scan showed that the nodule was cold.  We repeated an FNA in 2021 with again inconclusive results (FLUS), but the Afirma molecular marker returned benign, which, we discussed, was associated with a low risk of cancer, less than 4%.  We repeated a thyroid  ultrasound in 2022 and the nodule appears to be unchanged.  After last year, we checked another ultrasound (10/06/2023), and the nodule was stable, without the need for further imaging follow-up. -No neck compression symptoms -Will continue to follow her clinically for this  2.  Graves disease - Previously manifesting with subclinical thyrotoxicosis.  She initially had normal thyroid  function in 2017 and 2019, but the TSH became suppressed in 2020.  Free thyroid  hormones were normal.  TSI's were elevated.  Thyroid  scan was homogeneous except for a nodule and she had elevated iodine uptake, confirming Graves' disease.  We initially started methimazole , 5 mg daily,  but she stopped this after the thyroid  uptake and scan as she misunderstood instructions.  Since the TSH remains low, we resumed methimazole .  Her cardiologist, Dr. Anner, recommended to taper down atenolol  and then stop.  She misunderstood instructions and stopped both methimazole  and atenolol .  Fortunately, her TFTs were normal afterwards and TSI is normalized, so we continued off methimazole . -At today's visit, she has no signs or symptoms of thyrotoxicosis: No tremors, palpitations, heat intolerance, anxiety, insomnia, unintentional weight loss - Will recheck her TFTs today and see if we need to restart methimazole  - Otherwise, plan to see her back in a year -but after this, if prefers, she can continue to follow-up with PCP with annual TFTs  Orders Placed This Encounter  Procedures   TSH   T4, free   T3, free   Lela Fendt, MD PhD [facility]

## 2024-09-30 NOTE — Patient Instructions (Addendum)
 Please continue to stay off Methimazole  for now.  Please come back for a follow-up appointment in 1 year.

## 2024-10-01 ENCOUNTER — Ambulatory Visit: Payer: Self-pay | Admitting: Internal Medicine

## 2024-10-09 ENCOUNTER — Other Ambulatory Visit (HOSPITAL_COMMUNITY)
Admission: RE | Admit: 2024-10-09 | Discharge: 2024-10-09 | Disposition: A | Source: Ambulatory Visit | Attending: Obstetrics | Admitting: Obstetrics

## 2024-10-09 ENCOUNTER — Encounter: Payer: Self-pay | Admitting: Obstetrics

## 2024-10-09 ENCOUNTER — Ambulatory Visit: Admitting: Obstetrics

## 2024-10-09 VITALS — Ht 64.0 in | Wt 237.7 lb

## 2024-10-09 DIAGNOSIS — E569 Vitamin deficiency, unspecified: Secondary | ICD-10-CM | POA: Diagnosis not present

## 2024-10-09 DIAGNOSIS — N946 Dysmenorrhea, unspecified: Secondary | ICD-10-CM | POA: Diagnosis not present

## 2024-10-09 DIAGNOSIS — E66813 Obesity, class 3: Secondary | ICD-10-CM

## 2024-10-09 DIAGNOSIS — Z01419 Encounter for gynecological examination (general) (routine) without abnormal findings: Secondary | ICD-10-CM | POA: Insufficient documentation

## 2024-10-09 DIAGNOSIS — Z6841 Body Mass Index (BMI) 40.0 and over, adult: Secondary | ICD-10-CM

## 2024-10-09 MED ORDER — PNV-SELECT 27-0.6-0.4 MG PO TABS: 1.0000 | ORAL_TABLET | Freq: Every day | ORAL | 3 refills | Status: AC

## 2024-10-09 MED ORDER — IBUPROFEN 800 MG PO TABS: 800.0000 mg | ORAL_TABLET | Freq: Three times a day (TID) | ORAL | 5 refills | Status: AC | PRN

## 2024-10-09 NOTE — Progress Notes (Signed)
 Subjective:        Tonya Stokes is a 48 y.o. female here for a routine exam.  Current complaints: None.    Personal health questionnaire:  Is patient Tonya Stokes, have a family history of breast and/or ovarian cancer: yes Is there a family history of uterine cancer diagnosed at age < 24, gastrointestinal cancer, urinary tract cancer, family member who is a Personnel Officer syndrome-associated carrier: no Is the patient overweight and hypertensive, family history of diabetes, personal history of gestational diabetes, preeclampsia or PCOS: no Is patient over 44, have PCOS,  family history of premature CHD under age 39, diabetes, smoke, have hypertension or peripheral artery disease:  no At any time, has a partner hit, kicked or otherwise hurt or frightened you?: no Over the past 2 weeks, have you felt down, depressed or hopeless?: no Over the past 2 weeks, have you felt little interest or pleasure in doing things?:no   Gynecologic History No LMP recorded (lmp unknown). Contraception: none Last Pap: 2021. Results were: normal Last mammogram: 2025. Results were: normal  Obstetric History OB History  Gravida Para Term Preterm AB Living  2     2  SAB IAB Ectopic Multiple Live Births      2    # Outcome Date GA Lbr Len/2nd Weight Sex Type Anes PTL Lv  2 Gravida      CS-LVertical     1 Gravida      CS-LVertical       Past Medical History:  Diagnosis Date   Graves disease    On atenolol  and methimazole    History of cardiac murmur    Per chart review -> 2D echo in April 2017 suggested increased velocities across aortic valve.  No valvular or subvalvular stenosis.   Obesity, Class III, BMI 40-49.9 (morbid obesity) (HCC)    BMI 40.3   Prediabetes    Sickle cell trait    Per chart review    Past Surgical History:  Procedure Laterality Date   CESAREAN SECTION  09/02/2007   CESAREAN SECTION  02/27/2011   GXT/ETT  04/2016    Exercised 7 min = 8.5 METS.  Peak HR 166 bpm equals 92%  MPHR (179 bpm).  Mild hypertensive response.  Upsloping ST segments-nondiagnostic.  NEGATIVE/ADEQUATE ETT   TRANSTHORACIC ECHOCARDIOGRAM  03/2016   Technically difficult study.  EF 60 to 65%.  Normal wall motion and diastolic parameters.  Aortic valve not well visualized minimally increased velocity across the valve.     Current Outpatient Medications:    atorvastatin (LIPITOR) 10 MG tablet, Take 10 mg by mouth daily., Disp: , Rfl:    metFORMIN (GLUCOPHAGE-XR) 500 MG 24 hr tablet, Take by mouth., Disp: , Rfl:  No Known Allergies  Social History   Tobacco Use   Smoking status: Never   Smokeless tobacco: Never  Substance Use Topics   Alcohol use: No    Family History  Problem Relation Age of Onset   Irregular heart beat Mother    Other Father        Unknown, daughter early age   Breast cancer Neg Hx       Review of Systems  Constitutional: negative for fatigue and weight loss Respiratory: negative for cough and wheezing Cardiovascular: negative for chest pain, fatigue and palpitations Gastrointestinal: negative for abdominal pain and change in bowel habits Musculoskeletal:negative for myalgias Neurological: negative for gait problems and tremors Behavioral/Psych: negative for abusive relationship, depression Endocrine: negative for temperature intolerance  Genitourinary:negative for abnormal menstrual periods, genital lesions, hot flashes, sexual problems and vaginal discharge Integument/breast: negative for breast lump, breast tenderness, nipple discharge and skin lesion(s)    Objective:       Ht 5' 4 (1.626 m)   Wt 237 lb 11.2 oz (107.8 kg)   LMP  (LMP Unknown)   BMI 40.80 kg/m  General:   Alert and no distress  Skin:   no rash or abnormalities  Lungs:   clear to auscultation bilaterally  Heart:   regular rate and rhythm, S1, S2 normal, no murmur, click, rub or gallop  Breasts:   normal without suspicious masses, skin or nipple changes or axillary nodes   Abdomen:  normal findings: no organomegaly, soft, non-tender and no hernia  Pelvis:  External genitalia: normal general appearance Urinary system: urethral meatus normal and bladder without fullness, nontender Vaginal: normal without tenderness, induration or masses Cervix: normal appearance Adnexa: normal bimanual exam Uterus: anteverted and non-tender, normal size   Lab Review Urine pregnancy test Labs reviewed yes Radiologic studies reviewed yes  I have spent a total of 20 minutes of face-to-face time, excluding clinical staff time, reviewing notes and preparing to see patient, ordering tests and/or medications, and counseling the patient.   Assessment:    1. Encounter for gynecological examination with Papanicolaou smear of cervix (Primary) Rx: - Cytology - PAP( [facility])  2. Dysmenorrhea Rx: - ibuprofen  (ADVIL ) 800 MG tablet; Take 1 tablet (800 mg total) by mouth every 8 (eight) hours as needed.  Dispense: 30 tablet; Refill: 5  3. Vitamin deficiency Rx: - Prenatal Vit w/Fe-Methylfol-FA (PNV-SELECT) 27-0.6-0.4 MG TABS; Take 1 tablet by mouth daily before breakfast.  Dispense: 90 tablet; Refill: 3  4. Class 3 severe obesity due to excess calories without serious comorbidity with body mass index (BMI) of 40.0 to 44.9 in adult (HCC) - weight reduction with the aid of dietary changes, exercise and behavioral modification encouraged     Plan:    Education reviewed: calcium supplements, depression evaluation, low fat, low cholesterol diet, safe sex/STD prevention, self breast exams, and weight bearing exercise. Contraception: none. Follow up in: 1 year.     CARLIN RONAL CENTERS, MD, FACOG Attending Obstetrician & Gynecologist, [facility], [facility], [place] 10/09/2024

## 2024-10-09 NOTE — Progress Notes (Signed)
 Pt presents for annual. Pt states that she has not had a period for 9 months or longer. No other questions or concerns at this time. Last pap was 04/28/20, last mammogram was 02/09/2024

## 2024-10-16 LAB — CYTOLOGY - PAP
Adequacy: ABSENT
Comment: NEGATIVE
Diagnosis: NEGATIVE
Diagnosis: REACTIVE
High risk HPV: NEGATIVE

## 2025-09-30 ENCOUNTER — Ambulatory Visit: Admitting: Internal Medicine
